# Patient Record
Sex: Female | Born: 1950 | Race: White | Hispanic: No | Smoking: Never smoker
Health system: Southern US, Community
[De-identification: ages and names within clinical notes are randomized; demographics above are authoritative.]

## PROBLEM LIST (undated history)

## (undated) DIAGNOSIS — G43909 Migraine, unspecified, not intractable, without status migrainosus: Secondary | ICD-10-CM

## (undated) DIAGNOSIS — F32A Depression, unspecified: Secondary | ICD-10-CM

## (undated) DIAGNOSIS — F329 Major depressive disorder, single episode, unspecified: Secondary | ICD-10-CM

## (undated) DIAGNOSIS — D219 Benign neoplasm of connective and other soft tissue, unspecified: Secondary | ICD-10-CM

## (undated) DIAGNOSIS — F419 Anxiety disorder, unspecified: Secondary | ICD-10-CM

## (undated) DIAGNOSIS — K219 Gastro-esophageal reflux disease without esophagitis: Secondary | ICD-10-CM

## (undated) HISTORY — DX: Major depressive disorder, single episode, unspecified: F32.9

## (undated) HISTORY — DX: Migraine, unspecified, not intractable, without status migrainosus: G43.909

## (undated) HISTORY — DX: Anxiety disorder, unspecified: F41.9

## (undated) HISTORY — DX: Gastro-esophageal reflux disease without esophagitis: K21.9

## (undated) HISTORY — DX: Depression, unspecified: F32.A

## (undated) HISTORY — DX: Benign neoplasm of connective and other soft tissue, unspecified: D21.9

## (undated) HISTORY — PX: INCONTINENCE SURGERY: SHX676

---

## 1990-06-09 HISTORY — PX: NEPHRECTOMY: SHX65

## 1995-06-10 HISTORY — PX: ABDOMINAL HYSTERECTOMY: SHX81

## 1999-04-23 ENCOUNTER — Encounter: Admission: RE | Admit: 1999-04-23 | Discharge: 1999-04-23 | Payer: Self-pay | Admitting: Family Medicine

## 1999-04-23 ENCOUNTER — Encounter: Payer: Self-pay | Admitting: Family Medicine

## 1999-04-30 ENCOUNTER — Encounter: Payer: Self-pay | Admitting: Family Medicine

## 1999-04-30 ENCOUNTER — Encounter: Admission: RE | Admit: 1999-04-30 | Discharge: 1999-04-30 | Payer: Self-pay | Admitting: Family Medicine

## 2000-05-05 ENCOUNTER — Encounter: Payer: Self-pay | Admitting: Family Medicine

## 2000-05-05 ENCOUNTER — Ambulatory Visit (HOSPITAL_COMMUNITY): Admission: RE | Admit: 2000-05-05 | Discharge: 2000-05-05 | Payer: Self-pay | Admitting: Family Medicine

## 2001-05-07 ENCOUNTER — Encounter: Payer: Self-pay | Admitting: Family Medicine

## 2001-05-07 ENCOUNTER — Ambulatory Visit (HOSPITAL_COMMUNITY): Admission: RE | Admit: 2001-05-07 | Discharge: 2001-05-07 | Payer: Self-pay | Admitting: Family Medicine

## 2002-05-26 ENCOUNTER — Ambulatory Visit (HOSPITAL_COMMUNITY): Admission: RE | Admit: 2002-05-26 | Discharge: 2002-05-26 | Payer: Self-pay | Admitting: Gastroenterology

## 2002-05-31 ENCOUNTER — Encounter: Payer: Self-pay | Admitting: Family Medicine

## 2002-05-31 ENCOUNTER — Ambulatory Visit (HOSPITAL_COMMUNITY): Admission: RE | Admit: 2002-05-31 | Discharge: 2002-05-31 | Payer: Self-pay | Admitting: Family Medicine

## 2003-06-14 ENCOUNTER — Ambulatory Visit (HOSPITAL_COMMUNITY): Admission: RE | Admit: 2003-06-14 | Discharge: 2003-06-14 | Payer: Self-pay | Admitting: Family Medicine

## 2004-01-22 ENCOUNTER — Ambulatory Visit (HOSPITAL_COMMUNITY): Admission: RE | Admit: 2004-01-22 | Discharge: 2004-01-22 | Payer: Self-pay | Admitting: Gastroenterology

## 2004-01-22 ENCOUNTER — Encounter (INDEPENDENT_AMBULATORY_CARE_PROVIDER_SITE_OTHER): Payer: Self-pay | Admitting: *Deleted

## 2004-07-16 ENCOUNTER — Ambulatory Visit (HOSPITAL_COMMUNITY): Admission: RE | Admit: 2004-07-16 | Discharge: 2004-07-16 | Payer: Self-pay | Admitting: Family Medicine

## 2004-09-19 ENCOUNTER — Encounter: Admission: RE | Admit: 2004-09-19 | Discharge: 2004-12-18 | Payer: Self-pay | Admitting: Urology

## 2005-07-28 ENCOUNTER — Ambulatory Visit (HOSPITAL_COMMUNITY): Admission: RE | Admit: 2005-07-28 | Discharge: 2005-07-28 | Payer: Self-pay | Admitting: Family Medicine

## 2005-07-28 ENCOUNTER — Encounter: Admission: RE | Admit: 2005-07-28 | Discharge: 2005-07-28 | Payer: Self-pay | Admitting: Family Medicine

## 2005-09-02 ENCOUNTER — Ambulatory Visit (HOSPITAL_COMMUNITY): Admission: RE | Admit: 2005-09-02 | Discharge: 2005-09-03 | Payer: Self-pay | Admitting: Urology

## 2006-06-08 ENCOUNTER — Encounter: Admission: RE | Admit: 2006-06-08 | Discharge: 2006-06-08 | Payer: Self-pay | Admitting: Family Medicine

## 2006-07-30 ENCOUNTER — Ambulatory Visit (HOSPITAL_COMMUNITY): Admission: RE | Admit: 2006-07-30 | Discharge: 2006-07-30 | Payer: Self-pay | Admitting: Family Medicine

## 2006-08-19 ENCOUNTER — Encounter (INDEPENDENT_AMBULATORY_CARE_PROVIDER_SITE_OTHER): Payer: Self-pay | Admitting: Specialist

## 2006-08-19 ENCOUNTER — Encounter: Admission: RE | Admit: 2006-08-19 | Discharge: 2006-08-19 | Payer: Self-pay | Admitting: Family Medicine

## 2007-08-30 ENCOUNTER — Ambulatory Visit (HOSPITAL_COMMUNITY): Admission: RE | Admit: 2007-08-30 | Discharge: 2007-08-30 | Payer: Self-pay | Admitting: Family Medicine

## 2008-10-26 ENCOUNTER — Ambulatory Visit (HOSPITAL_COMMUNITY): Admission: RE | Admit: 2008-10-26 | Discharge: 2008-10-26 | Payer: Self-pay | Admitting: Family Medicine

## 2008-10-26 ENCOUNTER — Encounter: Admission: RE | Admit: 2008-10-26 | Discharge: 2008-10-26 | Payer: Self-pay | Admitting: Family Medicine

## 2010-06-29 ENCOUNTER — Encounter: Payer: Self-pay | Admitting: Family Medicine

## 2010-10-25 NOTE — Op Note (Signed)
Dominique Scott, Dominique Scott                           ACCOUNT NO.:  [id number]   MEDICAL RECORD NO.:  [id number]                   PATIENT TYPE:  AMB   LOCATION:  ENDO                                 FACILITY:  [facility]   PHYSICIAN:  Anselmo Rod, M.D.               DATE OF BIRTH:  [birth year]   DATE OF PROCEDURE:  01/22/2004  DATE OF DISCHARGE:                                 OPERATIVE REPORT   PROCEDURE PERFORMED:  Colonoscopy with snare polypectomy x1.   ENDOSCOPIST:  Anselmo Rod, M.D.   INSTRUMENT USED:  Olympus videocolonoscope (adjustable pediatric scope).   INDICATION FOR THE PROCEDURE:  A 60 year old white female with a personal  history of liposarcoma of the right kidney undergoing screening colonoscopy.  The patient has had a right nephrectomy in the past.  She has had occasional  BRBPR which she attributes to her hemorrhoids.  There is no history of  change in bowel habits, abnormal weight loss, etc.   PREPROCEDURE PREPARATION:  Informed consent was procured from the patient.  The patient was fasted for eight hours prior to the procedure and prepped  with a bottle of magnesium citrate and a gallon of GoLYTELY the night prior  to the procedure.   PREPROCEDURE PHYSICAL:  VITAL SIGNS:  Stable.  NECK:  Supple.  CHEST:  Clear to auscultation.  S1 and S2 regular.  ABDOMEN:  Soft with normal bowel sounds.   DESCRIPTION OF THE PROCEDURE:  The patient was placed in the left lateral  decubitus position and sedated with 100 mg of Demerol and 10 mg of Versed iu  slow incremental doses.  Once the patient was adequately sedated and  maintained on low-flow oxygen and continuous cardiac monitoring, the Olympus  videocolonoscope was advanced from the rectum to the cecum.  The patient had  a tortuous colon.  The patient's position was changed from the left lateral  to the supine and then the right lateral position with gentle application of  abdominal pressure to reach the cecal base.   The appendicular orifice and  the ileocecal valve were visualized and photographed.  The terminal ileum  appeared healthy and without lesions.  A small sessile polyp was snared from  the proximal right colon by snare polypectomy.  No diverticulosis was noted.  No other masses or polyps were identified.  Retroflexion in the rectum  revealed small, nonbleeding internal hemorrhoids.  There was some residual  stool in the colon.  Multiple washes were done.  The patient's position was  changed to visualize the underlying mucosa.  Visualization was adequate.  The residual air in the colon was gently suctioned prior to withdrawal of  the scope.  The patient tolerated the procedure well without complications.   IMPRESSION:  1. Small, nonbleeding internal hemorrhoids.  2. Small sessile polyp snared from the proximal right colon.  3. Normal-appearing terminal ileum.  4. Essentially unrevealing colonoscopy  except for the polyp mentioned in the     proximal right colon.  5. Some residual stool in the colon, multiple washes done.   RECOMMENDATIONS:  1. Await pathology results.  2. Repeat colorectal cancer screening depending on pathology results.  3. Avoid nonsteroidals for the next four weeks.  4. High fiber diet with liberal fluid intake.  5. Outpatient followup as need arises in the future.                                               Anselmo Rod, M.D.    JNM/MEDQ  D:  01/22/2004  T:  01/22/2004  Job:  [id number]   cc:   Duncan Dull, M.D.  [address]  [place]  [place] [address]  Fax: [phone]   Maretta Bees. Vonita Moss, M.D.  [address]  [place]  [place] [address]  Fax: [phone]

## 2010-10-25 NOTE — Op Note (Signed)
Dominique Scott, Dominique Scott                 ACCOUNT NO.:  [id number]   MEDICAL RECORD NO.:  [id number]          PATIENT TYPE:  OIB   LOCATION:  1006                         FACILITY:  [facility]   PHYSICIAN:  Martina Sinner, MD DATE OF BIRTH:  May 25, 1951   DATE OF PROCEDURE:  09/02/2005  DATE OF DISCHARGE:  09/03/2005                                 OPERATIVE REPORT   SURGEON:  Scott A. MacDiarmid, MD   ASSISTANT:  Cornelious Bryant, MD   PREOPERATIVE DIAGNOSES:  1.  Cystocele.  2.  Rectocele.   POSTOPERATIVE DIAGNOSES:  1.  Cystocele.  2.  Rectocele.   OPERATION:  1.  Cystocele repair utilizing dermal graft.  2.  Rectocele repair.  3.  Cystocele.   Mrs. Barbary has a symptomatic cystocele with a mild posterior defect.  She  continued to have an A&P repair today.  She was given preoperative Unasyn  and gentamicin.  Extra care was taken to minimize the risk of compartment  syndrome neuropathy using Yellofin stirrups.   She had a moderate-sized grade 2 cystocele though a fairly short anterior  vaginal wall.  I initially marked the dimples at the apex with a 3-0 Vicryl  suture.  I then used a T-shaped incision, incising along the anterior  vaginal wall.  I used lidocaine with epinephrine to help in the dissection  and inject it submucosally.  Approximately 10 mL was utilized.  I then  dissected the anterior vaginal wall from the pubocervical fascia bilaterally  all the way to the white line bilaterally.  I used my usual maneuver to make  certain I was beyond the vaginal wall.  I hypermobilized the bladder up and  just beyond the apex of the vagina which was marked with a 3-0 Vicryl  suture.  She had a short anterior vaginal wall.  I then did one layer of  imbricating Kelly plication of the anterior vaginal wall with 2-0 Vicryl,  reapproximating the pubocervical fascia.  I then placed four 2-0 Ethibond to  the pelvic sidewall at each corner.  I brought these through a 6 x 4-cm  graft.   One centimeter of the 7 x 4-cm graft had been trimmed off.  This fit  beautifully passing the Ethibond through all 4 corners.  The graft was sewn  in tension-free, and it really fit nicely over the cystocele.  One 3-0  Vicryl was used to tack the most cephalad medial aspect of the graft to the  bladder near the apex.  I then trimmed approximately 0.75 cm from each  anterior vaginal wall edge and reapproximated the anterior vaginal wall with  running 2-0 Vicryl.  There was one bleeder anteriorly near her left  urethrovesical angle that I over-sewed with an interrupted 2-0 Vicryl.  Overall hemostasis was good.  A running locking suture was used for the  anterior vaginal wall epithelium.  I then did a digital rectal examination.  I really felt that she needed a posterior repair since she had diffuse  weakness and bulging.   I then placed 2 Allis clamps inferior near the  hymenal ring and excised a  small triangle of perineal skin.  I then made a long anterior vaginal wall  incision utilizing my usual Allis technique to approximately 1 cm from the  marked dimples at the apex.  I mobilized the rectovaginal fascia from the  posterior vaginal wall epithelium widely, each to the pelvis sidewall in  each side.  There was no enterocele.  I did a digital rectal examination,  and she had diffuse weakness and no obvious site defect.  I therefore did a  2-layer running imbricating closure using 2-0 Vicryl, making sure that it  was tension-free and not too tight.  She had a few bleeding points that were  fulgurated, and one was oversewn, especially on her left side.  Hemostasis  was actually very good after this maneuver.  I then trimmed approximately  0.5 cm of vaginal epithelium off each side.  I identified the apex with an  Allis clamp.  I then closed the posterior vaginal wall with running 2-0  Vicryl, locking each suture.  At the level of the hymenal ring, I stopped my  running closure and did 1-0  Ethibond reapproximating the perineal body  gently.  I then re-delivered the 2-0 Vicryl into the perineal incision and  closed the skin with running subcuticular suture.   When I was doing the posterior repair, she was bleeding a little bit from  the anterior vaginal wall, but we only needed to aspirate blood on 2 or 3  occasions during the entire posterior repair from bleeding anteriorly.  I  felt that the bleeding was minimal and should be taken care of with a pack.  The posterior repair was actually quite dry before I closed, but as I was  putting in the pack, she was bleeding more than I anticipated through the  perineal skin closure.  For this reason, I placed a total of 3 packs into  the vaginal area firmly.  By no means did I feel that I had to re-open up  any incisions since the bleeding was not brisk whatsoever, but it was more  of a nuisance, and an external pad will have to be used postoperatively.  I  am going to keep a close eye on her hematocrit in the recovery room and  later on this afternoon.  She had a good hemoglobin prior to surgery.   Total blood loss during the case was approximately 350 mL.  She was  hemodynamically stable.   The patient had been cystoscoped after the anterior repair, and she had  efflux of indigo carmine from the left ureteral orifice.  She had the  typical camel hump bulging in the midline due to the imbrication.  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  Her preoperative creatinine was 1.0.   The Foley catheter was draining well at the end of the case.  Leg position  was good at the end of the case.  The patient was taken to the recovery  room.           ______________________________  Martina Sinner, MD  Electronically Signed     SAM/MEDQ  D:  09/02/2005  T:  09/04/2005  Job:  [id number]

## 2010-12-02 ENCOUNTER — Other Ambulatory Visit: Payer: Self-pay | Admitting: Dermatology

## 2010-12-25 ENCOUNTER — Other Ambulatory Visit: Payer: Self-pay | Admitting: Family Medicine

## 2010-12-25 ENCOUNTER — Ambulatory Visit
Admission: RE | Admit: 2010-12-25 | Discharge: 2010-12-25 | Disposition: A | Discharge status: Home / Self Care | Payer: BC Managed Care – PPO | Source: Ambulatory Visit | Attending: Family Medicine | Admitting: Family Medicine

## 2010-12-25 DIAGNOSIS — Z0189 Encounter for other specified special examinations: Secondary | ICD-10-CM

## 2012-07-23 ENCOUNTER — Encounter: Payer: Self-pay | Admitting: Certified Nurse Midwife

## 2012-07-28 ENCOUNTER — Encounter: Payer: Self-pay | Admitting: Certified Nurse Midwife

## 2012-09-03 ENCOUNTER — Encounter: Payer: Self-pay | Admitting: Certified Nurse Midwife

## 2012-09-07 ENCOUNTER — Ambulatory Visit (INDEPENDENT_AMBULATORY_CARE_PROVIDER_SITE_OTHER): Payer: 59 | Admitting: Certified Nurse Midwife

## 2012-09-07 ENCOUNTER — Encounter: Payer: Self-pay | Admitting: Certified Nurse Midwife

## 2012-09-07 VITALS — BP 100/62 | Ht 66.0 in | Wt 139.0 lb

## 2012-09-07 DIAGNOSIS — Z01419 Encounter for gynecological examination (general) (routine) without abnormal findings: Secondary | ICD-10-CM

## 2012-09-07 DIAGNOSIS — N952 Postmenopausal atrophic vaginitis: Secondary | ICD-10-CM | POA: Insufficient documentation

## 2012-09-07 NOTE — Patient Instructions (Signed)

## 2012-09-07 NOTE — Progress Notes (Signed)
62 y.o. DivorcedCaucasian female   [id number] here for annual exam. Menopausal denies vaginal bleeding.  Using Estrace cream for vaginal dryness with good results weekly. Olive oil works well Medications stable with hypertension  Sees PCP for medication management.  No issues today.   Patient's last menstrual period was 06/10/1995.          Sexually active: no  The current method of family planning is status post hysterectomy.    Exercising: yes  aerobic & walking Last mammogram: per patient 2013 Last pap: 1997  Last BMD: over 6 yrs per patient Alcohol: 1-2 a week Tobacco: none   Health Maintenance  Topic Date Due  . Influenza Vaccine  02/07/1954  . Pap Smear  03/13/1971  . Tetanus/tdap  03/12/1972  . Mammogram  10/27/2010  . Colonoscopy  01/07/2021    Family History  Problem Relation Age of Onset  . Hypertension Father     There is no problem list on file for this patient.   Past Medical History  Diagnosis Date  . Migraines   . GERD (gastroesophageal reflux disease)   . Anxiety   . Depression   . Fibroid     Past Surgical History  Procedure Laterality Date  . 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 Abdominal hysterectomy  1997    TAH-fibroids  . Incontinence surgery      bladder sling    Allergies: Contrast media  Current Outpatient Prescriptions  Medication Sig Dispense Refill  . ALPRAZolam (XANAX) 0.25 MG tablet Take 0.25 mg by mouth at bedtime as needed for sleep.      . AmLODIPine Besylate (NORVASC PO) Take by mouth daily.       . Artificial Tear Ointment (DRY EYES OP) Apply 1 drop to eye 2 (two) times daily.      . Atorvastatin Calcium (LIPITOR PO) Take by mouth daily.      . Cetirizine HCl (ZYRTEC ALLERGY PO) Take by mouth.      . cycloSPORINE (RESTASIS) 0.05 % ophthalmic emulsion 1 drop 2 (two) times daily.      Marland Kitchen doxycycline (PERIOSTAT) 20 MG tablet 3 (three) times a week.      . escitalopram (LEXAPRO) 10 MG tablet Take  10 mg by mouth.      . estradiol (ESTRACE) 0.1 MG/GM vaginal cream Place 1 g vaginally. Once a week      . EVENING PRIMROSE OIL PO Take by mouth daily.      Marland Kitchen lisinopril-hydrochlorothiazide (PRINZIDE,ZESTORETIC) 20-12.5 MG per tablet Take 1 tablet by mouth daily.      . mometasone (NASONEX) 50 MCG/ACT nasal spray Place 2 sprays into the nose.      . Multiple Vitamins-Minerals (MULTIVITAMIN PO) Take by mouth daily.      . Omega-3 Fatty Acids (FISH OIL PO) Take by mouth daily.      . potassium chloride SA (K-DUR,KLOR-CON) 20 MEQ tablet daily.      . rizatriptan (MAXALT) 5 MG tablet Take 5 mg by mouth as needed for migraine. May repeat in 2 hours if needed      . zolpidem (AMBIEN CR) 6.25 MG CR tablet Take 6.25 mg by mouth at bedtime as needed for sleep.       No current facility-administered medications for this visit.    ROS: A comprehensive review of systems was negative.  Exam:    BP 100/62  Ht 5\' 6"  (1.676 m)  Wt 139 lb (63.05 kg)  BMI 22.45 kg/m2  LMP 06/10/1995 Weight change: @WEIGHTCHANGE @ Last 3 height recordings:  Ht Readings from Last 3 Encounters:  09/07/12 5\' 6"  (1.676 m)   General appearance: alert and cooperative Head: Normocephalic, without obvious abnormality, atraumatic Neck: no adenopathy, supple, symmetrical, trachea midline and thyroid not enlarged, symmetric, no tenderness/mass/nodules Lungs: clear to auscultation bilaterally Breasts: normal appearance, no masses or tenderness, Inspection negative, No nipple retraction or dimpling Heart: regular rate and rhythm Abdomen: soft, non-tender; bowel sounds normal; no masses,  no organomegaly Extremities: extremities normal, atraumatic, no cyanosis or edema Skin: Skin color, texture, turgor normal. No rashes or lesions Lymph nodes: Cervical, supraclavicular, and axillary nodes normal. no inguinal nodes palpated Neurologic: Alert and oriented X 3, normal strength and tone. Normal symmetric reflexes. Normal coordination  and gait   Pelvic: External genitalia:  no lesions              Urethra: normal appearing urethra with no masses, tenderness or lesions              Bartholins and Skenes: normal, Bartholin's, Urethra, Skene's normal                 Vagina: normal appearing vagina with normal color and discharge, no lesions, atrophic              Cervix: absent              Pap taken: no        Bimanual Exam:  Uterus:  absent                                      Adnexa: no masses                                      Rectovaginal: Confirms                                      Anus:  normal sphincter tone, no lesions  A: well woman exam Menopausal so HRT s/p TAH due fibroids Hypertension stable on medication Atrophic vaginitis uses Estrace cream     P:Reviewed Health and wellness pertinent to exam Aware of need to advise if vaginal bleeding Continue follow up with MD Declines Rx would like to use Olive Oil exclusively unless changes occur.  Still has some Estrace cream if needed. Mammogram yearly RV annual   An After Visit Summary was printed and given to the patient.

## 2012-11-22 ENCOUNTER — Other Ambulatory Visit: Payer: Self-pay | Admitting: Certified Nurse Midwife

## 2012-11-22 NOTE — Telephone Encounter (Signed)
eScribe request for refill on ESTRACE VAGINAL CREAM Last filled - 08/27/11 Last AEX - 09/07/12 Next AEX - not scheduled Please advise refills.  Chart on your shelf.

## 2013-01-10 ENCOUNTER — Other Ambulatory Visit (HOSPITAL_COMMUNITY): Payer: Self-pay | Admitting: Family Medicine

## 2013-01-10 DIAGNOSIS — Z1231 Encounter for screening mammogram for malignant neoplasm of breast: Secondary | ICD-10-CM

## 2013-01-11 ENCOUNTER — Other Ambulatory Visit: Payer: Self-pay | Admitting: Family Medicine

## 2013-01-11 ENCOUNTER — Ambulatory Visit
Admission: RE | Admit: 2013-01-11 | Discharge: 2013-01-11 | Disposition: A | Discharge status: Home / Self Care | Payer: 59 | Source: Ambulatory Visit | Attending: Family Medicine | Admitting: Family Medicine

## 2013-01-11 DIAGNOSIS — C499 Malignant neoplasm of connective and soft tissue, unspecified: Secondary | ICD-10-CM

## 2013-01-13 ENCOUNTER — Ambulatory Visit (HOSPITAL_COMMUNITY)
Admission: RE | Admit: 2013-01-13 | Discharge: 2013-01-13 | Disposition: A | Discharge status: Home / Self Care | Payer: 59 | Source: Ambulatory Visit | Attending: Family Medicine | Admitting: Family Medicine

## 2013-01-13 DIAGNOSIS — Z1231 Encounter for screening mammogram for malignant neoplasm of breast: Secondary | ICD-10-CM | POA: Insufficient documentation

## 2013-04-14 ENCOUNTER — Other Ambulatory Visit: Payer: Self-pay

## 2013-08-03 ENCOUNTER — Telehealth: Payer: Self-pay | Admitting: Certified Nurse Midwife

## 2013-08-03 MED ORDER — ESTRADIOL 10 MCG VA TABS: ORAL_TABLET | VAGINAL | Status: DC

## 2013-08-03 NOTE — Telephone Encounter (Signed)
Routing to Debbi for review, can patient switch to Vagifem or Estring?

## 2013-08-03 NOTE — Telephone Encounter (Signed)
Patient needs a refill for ESTRACE VAGINAL 0.1 MG/GM vaginal cream  USE 1 GRAM VAGINALLY TWICE WEEKLY AS INSTRUCTED BY YOUR DOCTOR, Normal, Last Dose: Not Recorded  Refills: 3 ordered Pharmacy: [id number], [place]  but insurances wants her to switch to a lower tier price medication here were her options: vagifem Estring

## 2013-08-03 NOTE — Telephone Encounter (Signed)
Patient can switch to Vagifem, but will need instructions to use applicator holding tablet 2 x weekly

## 2013-08-03 NOTE — Telephone Encounter (Signed)
Order placed.  Message left to return call to [place] at [phone].

## 2013-08-05 NOTE — Telephone Encounter (Signed)
Detailed message left with instructions on how to use Vagifem with applicator 2 times per week. Advised need to schedule AEX with Regina Eck CNM as refills were placed until time for annual.  Routing to provider for final review. Patient agreeable to disposition. Will close encounter

## 2013-08-05 NOTE — Telephone Encounter (Signed)
Patient returning Tracy's call and states, "It is fine to leave a detailed message on voicemail."

## 2013-08-17 ENCOUNTER — Other Ambulatory Visit: Payer: Self-pay | Admitting: *Deleted

## 2013-08-17 NOTE — Telephone Encounter (Signed)
Fax From: [facility] for Vagifem Last Refilled: 08/03/13 #8/2 refills Aex Scheduled: 09/14/13  Fax requesting new rx is okay to send in rx or have patient wait until AEX to get years worth?  Please advise.

## 2013-08-17 NOTE — Telephone Encounter (Signed)
Wait for aex so we can assess

## 2013-08-18 MED ORDER — ESTRADIOL 10 MCG VA TABS: ORAL_TABLET | VAGINAL | Status: DC

## 2013-08-19 NOTE — Telephone Encounter (Signed)
Okay; thanks.

## 2013-09-14 ENCOUNTER — Ambulatory Visit (INDEPENDENT_AMBULATORY_CARE_PROVIDER_SITE_OTHER): Payer: 59 | Admitting: Certified Nurse Midwife

## 2013-09-14 ENCOUNTER — Encounter: Payer: Self-pay | Admitting: Certified Nurse Midwife

## 2013-09-14 VITALS — BP 120/66 | HR 76 | Ht 66.25 in | Wt 133.0 lb

## 2013-09-14 DIAGNOSIS — Z0000 Encounter for general adult medical examination without abnormal findings: Secondary | ICD-10-CM

## 2013-09-14 DIAGNOSIS — Z01419 Encounter for gynecological examination (general) (routine) without abnormal findings: Secondary | ICD-10-CM

## 2013-09-14 DIAGNOSIS — N952 Postmenopausal atrophic vaginitis: Secondary | ICD-10-CM

## 2013-09-14 LAB — POCT URINALYSIS DIPSTICK
BILIRUBIN UA: NEGATIVE
Blood, UA: NEGATIVE
Glucose, UA: NEGATIVE
KETONES UA: NEGATIVE
LEUKOCYTES UA: NEGATIVE
Nitrite, UA: NEGATIVE
PH UA: 7
Protein, UA: NEGATIVE
Urobilinogen, UA: NEGATIVE

## 2013-09-14 MED ORDER — ESTRADIOL 10 MCG VA TABS: ORAL_TABLET | VAGINAL | Status: DC

## 2013-09-14 NOTE — Progress Notes (Signed)
Patient ID: Dominique Scott, female   DOB: 1950-07-12, 63 y.o.   MRN: [id number] 63 y.o. [id number] Divorced Caucasian Fe here for annual exam. Menopausal no HRT, no vaginal bleeding. Vaginal dryness better with Estrace cream, but now using Vagifem which is working well. Working Scientist, research (medical) now, enjoying the change. Sees PCP for medication management and aex/labs. No changes in medications. No health issues today.  Patient's last menstrual period was 06/10/1995.          Sexually active: no  The current method of family planning is abstinence.    Exercising: yes  Gym/ health club routine includes cardio 3 times per week, yoga and pilates every other day.. Smoker:  no  Health Maintenance: Pap:  1997 MMG:  01/13/13; Bi-Rads 1: negative Colonoscopy:  01/08/11, polyp BMD:  2010 TDaP:  UTD Labs:  HB:  PCP Urine:negative     reports that she has never smoked. She has never used smokeless tobacco. She reports that she drinks about one ounce of alcohol per week. She reports that she does not use illicit drugs.  Past Medical History  Diagnosis Date  . Migraines   . GERD (gastroesophageal reflux disease)   . Anxiety   . Depression   . Fibroid     Past Surgical History  Procedure Laterality Date  . 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 Abdominal hysterectomy  1997    TAH-fibroids  . Incontinence surgery      bladder sling    Current Outpatient Prescriptions  Medication Sig Dispense Refill  . ALPRAZolam (XANAX) 0.25 MG tablet Take 0.25 mg by mouth at bedtime as needed for sleep.      . AmLODIPine Besylate (NORVASC PO) Take by mouth daily.       . Artificial Tear Ointment (DRY EYES OP) Apply 1 drop to eye 2 (two) times daily.      . Atorvastatin Calcium (LIPITOR PO) Take by mouth daily.      . Cetirizine HCl (ZYRTEC ALLERGY PO) Take by mouth.      . cholecalciferol (VITAMIN D) 1000 UNITS tablet Take 1,000 Units by mouth daily.      . cycloSPORINE (RESTASIS) 0.05 %  ophthalmic emulsion 1 drop 2 (two) times daily.      Marland Kitchen doxycycline (PERIOSTAT) 20 MG tablet 3 (three) times a week.      . escitalopram (LEXAPRO) 10 MG tablet Take 10 mg by mouth.      . Estradiol 10 MCG TABS vaginal tablet Place one tablet vaginally with applicator two times per week.  8 tablet  2  . EVENING PRIMROSE OIL PO Take by mouth daily.      Marland Kitchen lisinopril-hydrochlorothiazide (PRINZIDE,ZESTORETIC) 20-12.5 MG per tablet Take 1 tablet by mouth daily.      . Lutein 10 MG TABS Take 1 tablet by mouth daily.      . Misc Natural Products (GLUCOS-CHONDROIT-MSM COMPLEX PO) Take 1 tablet by mouth daily.      . mometasone (NASONEX) 50 MCG/ACT nasal spray Place 2 sprays into the nose.      . Omega-3 Fatty Acids (FISH OIL PO) Take by mouth daily.      . potassium chloride SA (K-DUR,KLOR-CON) 20 MEQ tablet daily.      . rizatriptan (MAXALT) 5 MG tablet Take 5 mg by mouth as needed for migraine. May repeat in 2 hours if needed      . zolpidem (AMBIEN CR) 6.25 MG CR tablet Take 6.25  mg by mouth at bedtime as needed for sleep.       No current facility-administered medications for this visit.    Family History  Problem Relation Age of Onset  . Hypertension Father     ROS:  Pertinent items are noted in HPI.  Otherwise, a comprehensive ROS was negative.  Exam:   BP 120/66  Pulse 76  Ht 5' 6.25" (1.683 m)  Wt 133 lb (60.328 kg)  BMI 21.30 kg/m2  LMP 06/10/1995 Height: 5' 6.25" (168.3 cm)  Ht Readings from Last 3 Encounters:  09/14/13 5' 6.25" (1.683 m)  09/07/12 5\' 6"  (1.676 m)    General appearance: alert, cooperative and appears stated age Head: Normocephalic, without obvious abnormality, atraumatic Neck: no adenopathy, supple, symmetrical, trachea midline and thyroid normal to inspection and palpation and non-palpable Lungs: clear to auscultation bilaterally Breasts: normal appearance, no masses or tenderness, No nipple retraction or dimpling, No nipple discharge or bleeding, No  axillary or supraclavicular adenopathy Heart: regular rate and rhythm Abdomen: soft, non-tender; no masses,  no organomegaly Extremities: extremities normal, atraumatic, no cyanosis or edema Skin: Skin color, texture, turgor normal. No rashes or lesions Lymph nodes: Cervical, supraclavicular, and axillary nodes normal. No abnormal inguinal nodes palpated Neurologic: Grossly normal   Pelvic: External genitalia:  no lesions              Urethra:  normal appearing urethra with no masses, tenderness or lesions              Bartholin's and Skene's: normal                 Vagina: normal appearing vagina with normal color and discharge, no lesions              Cervix: absent              Pap taken: no Bimanual Exam:  Uterus:  uterus absent              Adnexa: normal adnexa and no mass, fullness, tenderness               Rectovaginal: Confirms               Anus:  normal sphincter tone, no lesions  A:  Well Woman with normal exam  Menopausal no HRT S/P TAH fibroids, ovaries retained  Atrophic vaginitis Vagifem working well  History of nephrectomy due liposarcoma  Hypertension/cholesterol/anxiety management with PCP   P:   Reviewed health and wellness pertinent to exam  Advise if problems with Vagifem has refills  Continue follow up as indicated  Pap smear as per guidelines   Mammogram yearly pap smear not taken today.  counseled on breast self exam, mammography screening, adequate intake of calcium and vitamin D, diet and exercise  return annually or prn  An After Visit Summary was printed and given to the patient.

## 2013-09-14 NOTE — Patient Instructions (Signed)

## 2013-09-16 NOTE — Progress Notes (Signed)
Reviewed personally.  M. Suzanne Mei Suits, MD.  

## 2013-12-15 ENCOUNTER — Other Ambulatory Visit (HOSPITAL_COMMUNITY): Payer: Self-pay | Admitting: Family Medicine

## 2013-12-15 DIAGNOSIS — Z1231 Encounter for screening mammogram for malignant neoplasm of breast: Secondary | ICD-10-CM

## 2014-01-16 ENCOUNTER — Ambulatory Visit (HOSPITAL_COMMUNITY)
Admission: RE | Admit: 2014-01-16 | Discharge: 2014-01-16 | Disposition: A | Discharge status: Home / Self Care | Payer: 59 | Source: Ambulatory Visit | Attending: Family Medicine | Admitting: Family Medicine

## 2014-01-16 DIAGNOSIS — Z1231 Encounter for screening mammogram for malignant neoplasm of breast: Secondary | ICD-10-CM | POA: Diagnosis present

## 2014-04-10 ENCOUNTER — Encounter: Payer: Self-pay | Admitting: Certified Nurse Midwife

## 2014-09-18 ENCOUNTER — Ambulatory Visit: Payer: 59 | Admitting: Certified Nurse Midwife

## 2014-10-19 ENCOUNTER — Ambulatory Visit: Payer: Self-pay | Admitting: Certified Nurse Midwife

## 2014-10-19 ENCOUNTER — Ambulatory Visit (INDEPENDENT_AMBULATORY_CARE_PROVIDER_SITE_OTHER): Payer: 59 | Admitting: Certified Nurse Midwife

## 2014-10-19 ENCOUNTER — Encounter: Payer: Self-pay | Admitting: Certified Nurse Midwife

## 2014-10-19 VITALS — BP 102/64 | HR 68 | Resp 16 | Ht 66.25 in | Wt 135.0 lb

## 2014-10-19 DIAGNOSIS — Z01419 Encounter for gynecological examination (general) (routine) without abnormal findings: Secondary | ICD-10-CM

## 2014-10-19 DIAGNOSIS — Z0000 Encounter for general adult medical examination without abnormal findings: Secondary | ICD-10-CM

## 2014-10-19 LAB — POCT URINALYSIS DIPSTICK
BILIRUBIN UA: NEGATIVE
GLUCOSE UA: NEGATIVE
KETONES UA: NEGATIVE
LEUKOCYTES UA: NEGATIVE
Nitrite, UA: NEGATIVE
PH UA: 5
Protein, UA: NEGATIVE
RBC UA: NEGATIVE
Urobilinogen, UA: NEGATIVE

## 2014-10-19 NOTE — Patient Instructions (Signed)

## 2014-10-19 NOTE — Progress Notes (Signed)
64 y.o. [id number] Divorced  Caucasian Fe here for annual exam. Menopausal no HRT. Denies vaginal bleeding or vaginal dryness. Olive oil working well for dryness. Sees PCP for aex and medication management for anxiety/ cholesterol/ labs. No health issues today.  Patient's last menstrual period was 06/10/1995.          Sexually active: No.  The current method of family planning is status post hysterectomy.    Exercising: Yes.    cardio,yoga,stretching Smoker:  no  Health Maintenance: Pap:  1997 MMG: 01-16-14 category b density,birads 1:neg Colonoscopy:  2012 polyp BMD:   2010 TDaP: up to date maybe 2011 Labs: Poct urine-neg Self breast exam: done monthly   reports that she has never smoked. She has never used smokeless tobacco. She reports that she drinks alcohol. She reports that she does not use illicit drugs.  Past Medical History  Diagnosis Date  . Migraines   . GERD (gastroesophageal reflux disease)   . Anxiety   . Depression   . Fibroid     Past Surgical History  Procedure Laterality Date  . 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 Abdominal hysterectomy  1997    TAH-fibroids  . Incontinence surgery      bladder sling    Current Outpatient Prescriptions  Medication Sig Dispense Refill  . ALPRAZolam (XANAX) 0.25 MG tablet Take 0.25 mg by mouth at bedtime as needed for sleep.    Marland Kitchen amLODipine (NORVASC) 5 MG tablet     . atorvastatin (LIPITOR) 10 MG tablet     . Cetirizine HCl (ZYRTEC ALLERGY PO) Take by mouth.    . cholecalciferol (VITAMIN D) 1000 UNITS tablet Take 1,000 Units by mouth daily.    . cycloSPORINE (RESTASIS) 0.05 % ophthalmic emulsion 1 drop 2 (two) times daily.    Marland Kitchen doxycycline (PERIOSTAT) 20 MG tablet 3 (three) times a week.    . escitalopram (LEXAPRO) 10 MG tablet Take 10 mg by mouth.    . Estradiol 10 MCG TABS vaginal tablet Place one tablet vaginally with applicator two times per week. 8 tablet 12  .  lisinopril-hydrochlorothiazide (PRINZIDE,ZESTORETIC) 20-25 MG per tablet     . Lutein 10 MG TABS Take 1 tablet by mouth daily.    . Misc Natural Products (GLUCOS-CHONDROIT-MSM COMPLEX PO) Take 1 tablet by mouth daily.    . mometasone (NASONEX) 50 MCG/ACT nasal spray Place 2 sprays into the nose.    . potassium chloride SA (K-DUR,KLOR-CON) 20 MEQ tablet daily.    . rizatriptan (MAXALT) 10 MG tablet   0  . zolpidem (AMBIEN CR) 6.25 MG CR tablet Take 6.25 mg by mouth at bedtime as needed for sleep.     No current facility-administered medications for this visit.    Family History  Problem Relation Age of Onset  . Hypertension Father     ROS:  Pertinent items are noted in HPI.  Otherwise, a comprehensive ROS was negative.  Exam:   BP 102/64 mmHg  Pulse 68  Resp 16  Ht 5' 6.25" (1.683 m)  Wt 135 lb (61.236 kg)  BMI 21.62 kg/m2  LMP 06/10/1995 Height: 5' 6.25" (168.3 cm) Ht Readings from Last 3 Encounters:  10/19/14 5' 6.25" (1.683 m)  09/14/13 5' 6.25" (1.683 m)  09/07/12 5\' 6"  (1.676 m)    General appearance: alert, cooperative and appears stated age Head: Normocephalic, without obvious abnormality, atraumatic Neck: no adenopathy, supple, symmetrical, trachea midline and thyroid normal to inspection and  palpation Lungs: clear to auscultation bilaterally Breasts: normal appearance, no masses or tenderness, No nipple retraction or dimpling, No nipple discharge or bleeding, No axillary or supraclavicular adenopathy Heart: regular rate and rhythm Abdomen: soft, non-tender; no masses,  no organomegaly Extremities: extremities normal, atraumatic, no cyanosis or edema Skin: Skin color, texture, turgor normal. No rashes or lesions Lymph nodes: Cervical, supraclavicular, and axillary nodes normal. No abnormal inguinal nodes palpated Neurologic: Grossly normal   Pelvic: External genitalia:  no lesions              Urethra:  normal appearing urethra with no masses, tenderness or  lesions              Bartholin's and Skene's: normal                 Vagina: normal appearing vagina with normal color and discharge, no lesions              Cervix: absent              Pap taken: No. Bimanual Exam:  Uterus:  uterus absent              Adnexa: normal adnexa and no mass, fullness, tenderness               Rectovaginal: Confirms               Anus:  normal sphincter tone, no lesions  Chaperone present: Yes  A:  Well Woman with normal exam  Menopausal no HRT S/P TAH due to fibroids  Atrophic vaginitis with Vagifem use working well  Hypertension/cholesterol/anxiety with PCP management  BMD due schedules with Dr. Inda Merlin  P:   Reviewed health and wellness pertinent to exam  Rx Vagifem not given per request will call when needed, continue Olive Oil use for moisture also  Continue MD follow up as indicated, reminded of BMD and due  Pap smear not taken today   counseled on breast self exam, mammography screening, adequate intake of calcium and vitamin D, diet and exercise  return annually or prn  An After Visit Summary was printed and given to the patient.

## 2014-10-22 NOTE — Progress Notes (Signed)
Reviewed personally.  M. Suzanne Tegh Franek, MD.  

## 2014-12-07 ENCOUNTER — Other Ambulatory Visit: Payer: Self-pay

## 2014-12-07 ENCOUNTER — Other Ambulatory Visit (HOSPITAL_COMMUNITY): Payer: Self-pay | Admitting: Family Medicine

## 2014-12-07 DIAGNOSIS — Z1231 Encounter for screening mammogram for malignant neoplasm of breast: Secondary | ICD-10-CM

## 2014-12-08 ENCOUNTER — Other Ambulatory Visit: Payer: Self-pay | Admitting: Family Medicine

## 2014-12-08 DIAGNOSIS — E2839 Other primary ovarian failure: Secondary | ICD-10-CM

## 2015-01-22 ENCOUNTER — Ambulatory Visit
Admission: RE | Admit: 2015-01-22 | Discharge: 2015-01-22 | Disposition: A | Discharge status: Home / Self Care | Payer: 59 | Source: Ambulatory Visit | Attending: Family Medicine | Admitting: Family Medicine

## 2015-01-22 ENCOUNTER — Ambulatory Visit
Admission: RE | Admit: 2015-01-22 | Discharge: 2015-01-22 | Disposition: A | Discharge status: Home / Self Care | Payer: 59 | Source: Ambulatory Visit

## 2015-01-22 DIAGNOSIS — Z1231 Encounter for screening mammogram for malignant neoplasm of breast: Secondary | ICD-10-CM

## 2015-01-22 DIAGNOSIS — E2839 Other primary ovarian failure: Secondary | ICD-10-CM

## 2015-02-05 ENCOUNTER — Other Ambulatory Visit: Payer: Self-pay | Admitting: Certified Nurse Midwife

## 2015-02-05 NOTE — Telephone Encounter (Signed)
Medication refill request: Vagifem  Last AEX:  10/19/14 DL Next AEX: 10/25/15 DL Last MMG (if hormonal medication request): 01/23/15 BIRADS1:neg Refill authorized: 09/14/13 #8tabs/ 12R. Today please advise.   Routed to [facility]

## 2015-10-24 ENCOUNTER — Ambulatory Visit: Payer: 59 | Admitting: Certified Nurse Midwife

## 2015-10-25 ENCOUNTER — Ambulatory Visit: Payer: 59 | Admitting: Certified Nurse Midwife

## 2015-10-26 ENCOUNTER — Ambulatory Visit (INDEPENDENT_AMBULATORY_CARE_PROVIDER_SITE_OTHER): Payer: 59 | Admitting: Certified Nurse Midwife

## 2015-10-26 ENCOUNTER — Encounter: Payer: Self-pay | Admitting: Certified Nurse Midwife

## 2015-10-26 VITALS — BP 110/62 | HR 68 | Resp 16 | Ht 65.75 in | Wt 139.0 lb

## 2015-10-26 DIAGNOSIS — Z01419 Encounter for gynecological examination (general) (routine) without abnormal findings: Secondary | ICD-10-CM | POA: Diagnosis not present

## 2015-10-26 DIAGNOSIS — IMO0002 Reserved for concepts with insufficient information to code with codable children: Secondary | ICD-10-CM

## 2015-10-26 DIAGNOSIS — N952 Postmenopausal atrophic vaginitis: Secondary | ICD-10-CM | POA: Diagnosis not present

## 2015-10-26 DIAGNOSIS — Z0000 Encounter for general adult medical examination without abnormal findings: Secondary | ICD-10-CM

## 2015-10-26 DIAGNOSIS — IMO0001 Reserved for inherently not codable concepts without codable children: Secondary | ICD-10-CM

## 2015-10-26 DIAGNOSIS — N8110 Cystocele, unspecified: Secondary | ICD-10-CM | POA: Diagnosis not present

## 2015-10-26 LAB — POCT URINALYSIS DIPSTICK
Bilirubin, UA: NEGATIVE
Glucose, UA: NEGATIVE
Ketones, UA: NEGATIVE
Leukocytes, UA: NEGATIVE
NITRITE UA: NEGATIVE
Protein, UA: NEGATIVE
RBC UA: NEGATIVE
UROBILINOGEN UA: NEGATIVE
pH, UA: 5

## 2015-10-26 NOTE — Progress Notes (Signed)
65 y.o. [id number] Divorced  Caucasian Fe here for annual exam. Menopausal no vaginal bleeding or vaginal dryness. Uses Olive Oil for vaginal dryness with good results and Vagifem.. Occasional stress incontinence. Working on [employer]. Denies any urinary frequency or urgency or pain. Had BMD with Dr. Inda Merlin some spine degeneration. Sees PCP for medication management for cholesterol/ hypertension/headache management/labs/aex. No health issues today. Enjoying life!  Patient's last menstrual period was 06/10/1995.          Sexually active: No.  The current method of family planning is status post hysterectomy.    Exercising: Yes.    cardio & yoga Smoker:  no  Health Maintenance: Pap:  1997 neg MMG: 01-22-15 category b density,birads 1:neg Colonoscopy:  2012 polyp f/u 57yrs BMD:   2016 TDaP:  2011 Shingles: no Pneumonia: no Hep C and HIV: HIV neg 13yrs ago Labs: poct urine-neg Self breast exam: done occ   reports that she has never smoked. She has never used smokeless tobacco. She reports that she drinks about 0.6 - 1.2 oz of alcohol per week. She reports that she does not use illicit drugs.  Past Medical History  Diagnosis Date  . Migraines   . GERD (gastroesophageal reflux disease)   . Anxiety   . Depression   . Fibroid     Past Surgical History  Procedure Laterality Date  . 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 Abdominal hysterectomy  1997    TAH-fibroids  . Incontinence surgery      bladder sling    Current Outpatient Prescriptions  Medication Sig Dispense Refill  . ALPRAZolam (XANAX) 0.25 MG tablet Take 0.25 mg by mouth at bedtime as needed for sleep.    Marland Kitchen amLODipine (NORVASC) 5 MG tablet     . atorvastatin (LIPITOR) 10 MG tablet     . cholecalciferol (VITAMIN D) 1000 UNITS tablet Take 1,000 Units by mouth daily.    . cycloSPORINE (RESTASIS) 0.05 % ophthalmic emulsion 1 drop 2 (two) times daily.    Marland Kitchen doxycycline (PERIOSTAT) 20 MG tablet 2  (two) times a week.     . escitalopram (LEXAPRO) 10 MG tablet Take 10 mg by mouth.    Marland Kitchen lisinopril-hydrochlorothiazide (PRINZIDE,ZESTORETIC) 20-25 MG per tablet     . loratadine (CLARITIN) 10 MG tablet Take 10 mg by mouth daily.    . Lutein 10 MG TABS Take 1 tablet by mouth daily.    . Misc Natural Products (GLUCOS-CHONDROIT-MSM COMPLEX PO) Take 1 tablet by mouth daily.    . mometasone (NASONEX) 50 MCG/ACT nasal spray Place 2 sprays into the nose as needed.     . potassium chloride SA (K-DUR,KLOR-CON) 20 MEQ tablet daily.    . rizatriptan (MAXALT) 10 MG tablet as needed.   0  . VAGIFEM 10 MCG TABS vaginal tablet Place one tablet vaginally  with applicator two times  per week. (Patient taking differently: once weekly) 24 tablet 4  . zolpidem (AMBIEN) 5 MG tablet      No current facility-administered medications for this visit.    Family History  Problem Relation Age of Onset  . Hypertension Father     ROS:  Pertinent items are noted in HPI.  Otherwise, a comprehensive ROS was negative.  Exam:   BP 110/62 mmHg  Pulse 68  Resp 16  Ht 5' 5.75" (1.67 m)  Wt 139 lb (63.05 kg)  BMI 22.61 kg/m2  LMP 06/10/1995 Height: 5' 5.75" (167 cm) Ht Readings  from Last 3 Encounters:  10/26/15 5' 5.75" (1.67 m)  10/19/14 5' 6.25" (1.683 m)  09/14/13 5' 6.25" (1.683 m)    General appearance: alert, cooperative and appears stated age Head: Normocephalic, without obvious abnormality, atraumatic Neck: no adenopathy, supple, symmetrical, trachea midline and thyroid normal to inspection and palpation Lungs: clear to auscultation bilaterally Breasts: normal appearance, no masses or tenderness, No nipple retraction or dimpling, No nipple discharge or bleeding, No axillary or supraclavicular adenopathy Heart: regular rate and rhythm Abdomen: soft, non-tender; no masses,  no organomegaly Extremities: extremities normal, atraumatic, no cyanosis or edema Skin: Skin color, texture, turgor normal. No rashes  or lesions Lymph nodes: Cervical, supraclavicular, and axillary nodes normal. No abnormal inguinal nodes palpated Neurologic: Grossly normal   Pelvic: External genitalia:  no lesions              Urethra:  normal appearing urethra with no masses, tenderness or lesions              Bartholin's and Skene's: normal                 Vagina: normal appearing vagina with normal color and discharge, no lesions              Cervix: no cervical motion tenderness, no lesions and normal appearance.              Pap taken: No. Bimanual Exam:  Uterus:  uterus absent              Adnexa: normal adnexa and no mass, fullness, tenderness               Rectovaginal: Confirms               Anus:  normal sphincter tone, no lesions  Chaperone present: yes  A:  Well Woman with normal exam  Menopausal no HRT S/P TAH due to fibroids  Atrophic vaginitis with Vagifem use with good results, desires continuance  Cystocele grade 2-3, previous sling repair 1997, occasional stress incontinence  Hypertension/cholesterol management with PCP    P:   Reviewed health and wellness pertinent to exam  Discussed risks/benefits of Vagifem, warning signs.  Rx Vagifem see order  Discussed finding of cystocele, but good pelvic support with Kegel muscle. Patient do not feel this is a problem, no increase in pressure or discomfort or incomplete emptying. Discussed needs to advise if becomes symptomatic. Discussed pessary support if needed. Questions addressed.  Continue follow up with PCP as indicated.  Pap smear as above not taken today   counseled on breast self exam, mammography screening, adequate intake of calcium and vitamin D, diet and exercise  return annually or prn  An After Visit Summary was printed and given to the patient.

## 2015-10-26 NOTE — Patient Instructions (Signed)

## 2015-10-27 NOTE — Progress Notes (Signed)
Reviewed personally.  M. Suzanne Louanna Vanliew, MD.  

## 2015-12-25 ENCOUNTER — Other Ambulatory Visit: Payer: Self-pay | Admitting: Family Medicine

## 2015-12-25 DIAGNOSIS — Z1231 Encounter for screening mammogram for malignant neoplasm of breast: Secondary | ICD-10-CM

## 2016-01-25 ENCOUNTER — Ambulatory Visit
Admission: RE | Admit: 2016-01-25 | Discharge: 2016-01-25 | Disposition: A | Discharge status: Home / Self Care | Payer: 59 | Source: Ambulatory Visit | Attending: Family Medicine | Admitting: Family Medicine

## 2016-01-25 DIAGNOSIS — Z1231 Encounter for screening mammogram for malignant neoplasm of breast: Secondary | ICD-10-CM

## 2016-02-09 ENCOUNTER — Other Ambulatory Visit: Payer: Self-pay | Admitting: Nurse Practitioner

## 2016-02-12 NOTE — Telephone Encounter (Signed)
Medication refill request: Vagifem Last AEX:  10/26/15 DL Next AEX: 10/28/16 DL Last MMG (if hormonal medication request): 8/21/17BIRADS1:neg  Refill authorized: 02/05/15 #24tabs/4R to [facility].  Today #24tabs/4R?

## 2016-10-28 ENCOUNTER — Ambulatory Visit: Payer: 59 | Admitting: Certified Nurse Midwife

## 2016-10-30 ENCOUNTER — Ambulatory Visit: Payer: 59 | Admitting: Certified Nurse Midwife

## 2016-10-30 ENCOUNTER — Encounter: Payer: Self-pay | Admitting: Certified Nurse Midwife

## 2016-10-30 VITALS — BP 102/64 | HR 68 | Resp 16 | Ht 65.75 in | Wt 141.0 lb

## 2016-10-30 DIAGNOSIS — N952 Postmenopausal atrophic vaginitis: Secondary | ICD-10-CM

## 2016-10-30 DIAGNOSIS — Z01419 Encounter for gynecological examination (general) (routine) without abnormal findings: Secondary | ICD-10-CM

## 2016-10-30 DIAGNOSIS — N8110 Cystocele, unspecified: Secondary | ICD-10-CM

## 2016-10-30 DIAGNOSIS — I10 Essential (primary) hypertension: Secondary | ICD-10-CM | POA: Insufficient documentation

## 2016-10-30 MED ORDER — ESTRADIOL 10 MCG VA TABS: 10.0000 ug | ORAL_TABLET | VAGINAL | 12 refills | Status: DC

## 2016-10-30 NOTE — Progress Notes (Signed)
66 y.o. [id number] Divorced  Caucasian Fe here for annual exam. Menopausal no HRT. [name] working well for vaginal moisture. Using once weekly. Denies vaginal bleeding.Dominique Scott PCP every 6 months for hypertension, cholesterol/anxiety medication and labs. All stable per patient. Feels her cystocele has increased, but doing kegel exercise and yoga. No stress incontinence except for prolong laughing only. Had previous repair several years ago with [facility]. No other health issues today. Planning trip to [place]!  Patient's last menstrual period was 06/10/1995.          Sexually active: No.  The current method of family planning is status post hysterectomy.    Exercising: Yes.    aerobics & yoga Smoker:  no  Health Maintenance: Pap:  1997 neg History of Abnormal Pap: no MMG:  01-25-16 category b density birads 1:neg Self Breast exams: yes Colonoscopy:  2012 polyp f/u 23yrs, per dr Collene Mares she can go past 65yrs & they will contact her when due BMD:   2016 repeat 2-3 years PCP manages TDaP:  2011 Shingles: no Pneumonia: had done Hep C and HIV: HIV neg 31yrs ago Labs: none   reports that she has never smoked. She has never used smokeless tobacco. She reports that she drinks about 0.6 - 1.2 oz of alcohol per week . She reports that she does not use drugs.  Past Medical History:  Diagnosis Date  . Anxiety   . Depression   . Fibroid   . GERD (gastroesophageal reflux disease)   . Migraines     Past Surgical History:  Procedure Laterality Date  . ABDOMINAL HYSTERECTOMY  1997   TAH-fibroids  . INCONTINENCE SURGERY     bladder sling  . 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 ALPRAZolam (XANAX) 0.25 MG tablet Take 0.25 mg by mouth at bedtime as needed for sleep.    Marland Kitchen amLODipine (NORVASC) 5 MG tablet     . atorvastatin (LIPITOR) 10 MG tablet     . Calcium Citrate-Vitamin D (CALCIUM + D  PO) Take by mouth.    . cetirizine (ZYRTEC) 10 MG tablet Take 10 mg by mouth daily.    Marland Kitchen escitalopram (LEXAPRO) 10 MG tablet Take 10 mg by mouth.    Marland Kitchen lisinopril-hydrochlorothiazide (PRINZIDE,ZESTORETIC) 20-25 MG per tablet     . Misc Natural Products (GLUCOS-CHONDROIT-MSM COMPLEX PO) Take 1 tablet by mouth daily.    . mometasone (NASONEX) 50 MCG/ACT nasal spray Place 2 sprays into the nose as needed.     . potassium chloride SA (K-DUR,KLOR-CON) 20 MEQ tablet daily.    . rizatriptan (MAXALT) 10 MG tablet as needed.   0  . tretinoin (RETIN-A) 0.025 % cream   0  . [name] 10 MCG TABS vaginal tablet Place one tablet vaginally  with applicator two times  per week. 24 tablet 3  . zolpidem (AMBIEN) 5 MG tablet      No current facility-administered medications for this visit.     Family History  Problem Relation Age of Onset  . Hypertension Father     ROS:  Pertinent items are noted in HPI.  Otherwise, a comprehensive ROS was negative.  Exam:   BP 102/64   Pulse 68   Resp 16   Ht 5' 5.75" (1.67 m)   Wt 141 lb (64 kg)   LMP 06/10/1995   BMI 22.93 kg/m  Height: 5' 5.75" (167 cm) Ht  Readings from Last 3 Encounters:  10/30/16 5' 5.75" (1.67 m)  10/26/15 5' 5.75" (1.67 m)  10/19/14 5' 6.25" (1.683 m)    General appearance: alert, cooperative and appears stated age Head: Normocephalic, without obvious abnormality, atraumatic Neck: no adenopathy, supple, symmetrical, trachea midline and thyroid normal to inspection and palpation Lungs: clear to auscultation bilaterally Breasts: normal appearance, no masses or tenderness, No nipple retraction or dimpling, No nipple discharge or bleeding, No axillary or supraclavicular adenopathy Heart: regular rate and rhythm Abdomen: soft, non-tender; no masses,  no organomegaly Extremities: extremities normal, atraumatic, no cyanosis or edema Skin: Skin color, texture, turgor normal. No rashes or lesions Lymph nodes: Cervical, supraclavicular, and  axillary nodes normal. No abnormal inguinal nodes palpated Neurologic: Grossly normal   Pelvic: External genitalia:  no lesions              Urethra:  normal appearing urethra with no masses, tenderness or lesions              Bartholin's and Skene's: normal                 Vagina: normal appearing vagina with normal color and discharge, no lesions              Cervix: absent              Pap taken: No. Bimanual Exam:  Uterus:  uterus absent              Adnexa: normal adnexa and no mass, fullness, tenderness               Rectovaginal: Confirms               Anus:  normal sphincter tone, no lesions  Chaperone present: yes  A:  Well Woman with normal exam  Menopausal no HRT S/P TAH for fibroids, ovaries retained  Cystocele grade 2 previous sling repair in 1997 occasional stress incontinence, working with kegels and yoga  Atrophic vaginitis [name] working well, desires continuance  Cholesterol, hypertension/anxiety management with PCP  P:   Reviewed health and wellness pertinent to exam  Discussed slight increase in cystocele noted, but good pelvic support. Continue with kegel exercise and advise if changes in symptoms.  Discussed risks and benefits/warning signs and expectations of vaginal estrogen. Desires continuation  Rx [name] see order with instructions  Pap smear: no  counseled on breast self exam, mammography screening, adequate intake of calcium and vitamin D, diet and exercise  return annually or prn  An After Visit Summary was printed and given to the patient.

## 2016-10-30 NOTE — Patient Instructions (Signed)

## 2016-12-15 ENCOUNTER — Other Ambulatory Visit: Payer: Self-pay | Admitting: Family Medicine

## 2016-12-15 DIAGNOSIS — Z1231 Encounter for screening mammogram for malignant neoplasm of breast: Secondary | ICD-10-CM

## 2017-01-13 ENCOUNTER — Encounter: Payer: Self-pay | Admitting: Family Medicine

## 2017-01-13 ENCOUNTER — Ambulatory Visit (INDEPENDENT_AMBULATORY_CARE_PROVIDER_SITE_OTHER): Payer: 59 | Admitting: Family Medicine

## 2017-01-13 VITALS — BP 110/70 | Ht 66.0 in | Wt 138.0 lb

## 2017-01-13 DIAGNOSIS — M545 Low back pain, unspecified: Secondary | ICD-10-CM

## 2017-01-13 DIAGNOSIS — M4120 Other idiopathic scoliosis, site unspecified: Secondary | ICD-10-CM

## 2017-01-13 MED ORDER — MELOXICAM 15 MG PO TABS: 15.0000 mg | ORAL_TABLET | Freq: Every day | ORAL | 0 refills | Status: DC

## 2017-01-13 NOTE — Progress Notes (Signed)
    CHIEF COMPLAINT / HPI:   2 weeks of posterior right hip and low back pain. Started acutely but unclear what activity triggered it as nothing new.Pain originates posterior right hip, wraps around to front. Has improved slightly in last few days but still aggravating.Worseif bending over, raising leg forward. Does not radiate into thigh or down leg. No weakness. No urinary or stool incontinence. No skin lesions or fever. Never had it before.  REVIEW OF SYSTEMS:   see HPI.  PERTINENT  PMH / PSH: I have reviewed the patient's medications, allergies, past medical and surgical history, smoking status.  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  OBJECTIVE:  Vital signs are reviewed.    WDWNNAD BACK Lumbar scoliosis  Forward flexion and hyperextension FROm and painless. HIPS: B FROM IR/ER and painless. Mild ttp over right SI joint area but negative compression test, normal FABER/FADIR. Mild weakness Right hip ABductors c/w left and she has some pain with this movement. SKIN: area of low back and hip reveals no lesions or rash, no unusual erythema EXT: very flexible and 5/5 strength B = strength flexion and extension knee/hip/ankle NEURO: SLR normal in seated and supine positions.  ASSESSMENT / PLAN: 1. Posterior hip and low back pain 2 weeks 2. Mild right abductor weakness 3. 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 Will try 2 weeks antiinflammatory, get lumbar films, start hip ABductor exercises and f/u 2 weeks

## 2017-01-13 NOTE — Patient Instructions (Addendum)
[facility] - [address], [place] [address] Hulan Saas [phone]  I am calling in a rx for Mobic (meloxicam)--take one a day with food. Let's plan on two weeks of therapy.  I have given you some exercises to do once or twice a day.  I have sent in an order for X rays of your back Unless there is something unexpected on them I will go over them when I see you back. Let me see you in about 2 weeks. Please call sooner with any questions or problems. Great to meet you!

## 2017-01-14 ENCOUNTER — Ambulatory Visit
Admission: RE | Admit: 2017-01-14 | Discharge: 2017-01-14 | Disposition: A | Discharge status: Home / Self Care | Payer: 59 | Source: Ambulatory Visit | Attending: Family Medicine | Admitting: Family Medicine

## 2017-01-14 DIAGNOSIS — M545 Low back pain, unspecified: Secondary | ICD-10-CM

## 2017-01-14 DIAGNOSIS — M4120 Other idiopathic scoliosis, site unspecified: Secondary | ICD-10-CM

## 2017-01-20 ENCOUNTER — Encounter: Payer: Self-pay | Admitting: Family Medicine

## 2017-01-26 ENCOUNTER — Encounter: Payer: Self-pay | Admitting: Sports Medicine

## 2017-01-26 ENCOUNTER — Ambulatory Visit
Admission: RE | Admit: 2017-01-26 | Discharge: 2017-01-26 | Disposition: A | Discharge status: Home / Self Care | Payer: 59 | Source: Ambulatory Visit | Attending: Family Medicine | Admitting: Family Medicine

## 2017-01-26 ENCOUNTER — Ambulatory Visit (INDEPENDENT_AMBULATORY_CARE_PROVIDER_SITE_OTHER): Payer: 59 | Admitting: Sports Medicine

## 2017-01-26 DIAGNOSIS — M545 Low back pain: Secondary | ICD-10-CM | POA: Diagnosis not present

## 2017-01-26 DIAGNOSIS — Z1231 Encounter for screening mammogram for malignant neoplasm of breast: Secondary | ICD-10-CM

## 2017-01-26 DIAGNOSIS — M549 Dorsalgia, unspecified: Secondary | ICD-10-CM | POA: Insufficient documentation

## 2017-01-26 NOTE — Progress Notes (Signed)
   [facility] [address] [place], [place] [address] Phone: [phone] Fax: [phone]   Patient Name: MARIBELL DEMEO Date of Birth: [birth year] Medical Record Number: [id number] Gender: female Date of Encounter: 01/26/2017  History of Present Illness:  ELLE VEZINA is a 66 y.o. very pleasant female patient who presents with the following:  Low back pain: Patient seen earlier this month for 2 wk h/o right-sided low back pain. Lumbar imaging at the time showed severe levoscoliosis at L1-2 with corresponding significant disc space narrowing and multilevel fact arthropathy throughout lumbar spine. Plan at the time was for use of Mobix daily and abductor strengthening exercises for 2 weeks. Patient presents today with resolution of pain with above intervention. She denies leg weakness or numbness. Denies bowel or bladder incontinence.  Past Medical, Surgical, Social, and Family History Reviewed. Medications and Allergies reviewed and all updated if necessary.  Review of Systems:  Per HPI  Physical Examination: Vitals:   01/26/17 1618  BP: 118/82   Vitals:   01/26/17 1618  Weight: 138 lb (62.6 kg)  Height: 5' 6.5" (1.689 m)   Body mass index is 21.94 kg/m.  Constitutional: NAD, pleasant CV: warm extremities, pulses intact MSK: lumbar levoscoliosis, no tenderness to palpation of her spine/SI/paraspinal musculature. Hip abduction 5/5 bilaterally; hip flexion 5/5 bilaterally. Gait normal.   Assessment and Plan:  Right sided back pain/hip pain: Resolved with exercises and mobic. Lumbar xrays show significant DJD which was likely contributing to her pain. We discussed the general course of arthritis and possible future flares.  Plan: --PRN mobic for pain --advised cushioned shoe for job --f/u PRN  Alphonzo Grieve, MD   Patient seen and evaluated with the resident. I agree with the above plan of care. Patient is doing much better. Follow-up  as needed.

## 2017-01-26 NOTE — Assessment & Plan Note (Signed)
Resolved with exercises and mobic. Lumbar xrays show significant DJD which was likely contributing to her pain. We discussed the general course of arthritis and possible future flares.  Plan: --PRN mobic for pain --advised cushioned shoe for job --f/u PRN

## 2017-02-11 ENCOUNTER — Ambulatory Visit: Payer: 59 | Admitting: Family Medicine

## 2017-02-12 ENCOUNTER — Ambulatory Visit: Payer: 59 | Admitting: Family Medicine

## 2017-11-03 ENCOUNTER — Ambulatory Visit: Payer: 59 | Admitting: Certified Nurse Midwife

## 2017-11-04 ENCOUNTER — Ambulatory Visit: Payer: 59 | Admitting: Certified Nurse Midwife

## 2017-11-04 ENCOUNTER — Encounter: Payer: Self-pay | Admitting: Certified Nurse Midwife

## 2017-11-04 ENCOUNTER — Other Ambulatory Visit: Payer: Self-pay

## 2017-11-04 VITALS — BP 110/62 | HR 64 | Resp 16 | Ht 66.0 in | Wt 147.0 lb

## 2017-11-04 DIAGNOSIS — N952 Postmenopausal atrophic vaginitis: Secondary | ICD-10-CM

## 2017-11-04 DIAGNOSIS — Z780 Asymptomatic menopausal state: Secondary | ICD-10-CM

## 2017-11-04 DIAGNOSIS — Z659 Problem related to unspecified psychosocial circumstances: Secondary | ICD-10-CM | POA: Diagnosis not present

## 2017-11-04 DIAGNOSIS — Z01419 Encounter for gynecological examination (general) (routine) without abnormal findings: Secondary | ICD-10-CM

## 2017-11-04 MED ORDER — ESTRADIOL 10 MCG VA TABS: 10.0000 ug | ORAL_TABLET | VAGINAL | 12 refills | Status: DC

## 2017-11-04 NOTE — Patient Instructions (Signed)

## 2017-11-04 NOTE — Progress Notes (Signed)
67 y.o. [id number] Divorced  Caucasian Fe here for annual exam. Menopausal occasional hot flash. Denies vaginal bleeding or vaginal dryness. Sees PCP Darcus Austin twice yearly for hypertension/cholesterol/anxiety, all stable. Social stress with both parents passing away last year. Emotionally doing well. Vagifem working well for vaginal dryness. No health issues today. Spent Mother's day in [place] with son!  Patient's last menstrual period was 06/10/1995.          Sexually active: No.  The current method of family planning is status post hysterectomy.    Exercising: Yes.    Personal trainer Smoker:  no  Health Maintenance: Pap:  1997 neg History of Abnormal Pap: no MMG:  01-26-17 category b density birads 1:neg Self Breast exams: no Colonoscopy:  2012 f/u 7rys BMD:   2016 f/u 2-3 yrs PCP manages TDaP:  2018 Shingles: no Pneumonia: no Hep C and HIV: HIV neg 13 yrs ago Labs: no   reports that she has never smoked. She has never used smokeless tobacco. She reports that she drinks about 0.6 oz of alcohol per week. She reports that she does not use drugs.  Past Medical History:  Diagnosis Date  . Anxiety   . Depression   . Fibroid   . GERD (gastroesophageal reflux disease)   . Migraines     Past Surgical History:  Procedure Laterality Date  . ABDOMINAL HYSTERECTOMY  1997   TAH-fibroids  . INCONTINENCE SURGERY     bladder sling  . 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 ALPRAZolam (XANAX) 0.25 MG tablet Take 0.25 mg by mouth at bedtime as needed for sleep.    Marland Kitchen amLODipine (NORVASC) 5 MG tablet     . atorvastatin (LIPITOR) 10 MG tablet     . Calcium Citrate-Vitamin D (CALCIUM + D PO) Take by mouth.    . cetirizine (ZYRTEC) 10 MG tablet Take 10 mg by mouth daily.    Marland Kitchen escitalopram (LEXAPRO) 10 MG tablet Take 10 mg by mouth.    . Estradiol ([name]) 10 MCG TABS vaginal tablet Place 1  tablet (10 mcg total) vaginally 2 (two) times a week. 8 tablet 12  . lisinopril-hydrochlorothiazide (PRINZIDE,ZESTORETIC) 20-25 MG per tablet     . meloxicam (MOBIC) 15 MG tablet Take 1 tablet (15 mg total) by mouth daily. 30 tablet 0  . mometasone (NASONEX) 50 MCG/ACT nasal spray Place 2 sprays into the nose as needed.     . potassium chloride SA (K-DUR,KLOR-CON) 20 MEQ tablet daily.    Marland Kitchen tretinoin (RETIN-A) 0.025 % cream   0  . zolpidem (AMBIEN) 5 MG tablet      No current facility-administered medications for this visit.     Family History  Problem Relation Age of Onset  . Hypertension Father     ROS:  Pertinent items are noted in HPI.  Otherwise, a comprehensive ROS was negative.  Exam:   BP 110/62   Pulse 64   Resp 16   Ht 5\' 6"  (1.676 m)   Wt 147 lb (66.7 kg)   LMP 06/10/1995   BMI 23.73 kg/m  Height: 5\' 6"  (167.6 cm) Ht Readings from Last 3 Encounters:  11/04/17 5\' 6"  (1.676 m)  01/26/17 5' 6.5" (1.689 m)  01/13/17 5\' 6"  (1.676 m)    General appearance: alert, cooperative and appears stated age Head: Normocephalic, without obvious abnormality, atraumatic Neck: no adenopathy, supple, symmetrical, trachea midline and  thyroid normal to inspection and palpation Lungs: clear to auscultation bilaterally Breasts: normal appearance, no masses or tenderness, No nipple retraction or dimpling, No nipple discharge or bleeding, No axillary or supraclavicular adenopathy Heart: regular rate and rhythm Abdomen: soft, non-tender; no masses,  no organomegaly Extremities: extremities normal, atraumatic, no cyanosis or edema Skin: Skin color, texture, turgor normal. No rashes or lesions Lymph nodes: Cervical, supraclavicular, and axillary nodes normal. No abnormal inguinal nodes palpated Neurologic: Grossly normal   Pelvic: External genitalia:  no lesions              Urethra:  normal appearing urethra with no masses, tenderness or lesions              Bartholin's and Skene's:  normal                 Vagina: normal appearing vagina with normal color and discharge, no lesions              Cervix: absent              Pap taken: No. Bimanual Exam:  Uterus:  uterus absent              Adnexa: normal adnexa and no mass, fullness, tenderness               Rectovaginal: Confirms               Anus:  normal sphincter tone, no lesions  Chaperone present: yes  A:  Well Woman with normal exam  Menopausal no HRT  Atrophic vaginitis [name] working well  Social stress with parents death  Cholesterol, hypertension,anxiety with MD management  P:   Reviewed health and wellness pertinent to exam  Discussed risks/benefits/warning signs with [name] use. Desires continuance  Rx [name] see order with instructions  Encouraged to seek friend and family support as needed.  Continue with PCP follow up as indicated  Pap smear: no   counseled on breast self exam, mammography screening, feminine hygiene, adequate intake of calcium and vitamin D, diet and exercise  return annually or prn  An After Visit Summary was printed and given to the patient.

## 2017-12-16 ENCOUNTER — Other Ambulatory Visit: Payer: Self-pay | Admitting: Family Medicine

## 2017-12-16 DIAGNOSIS — Z1231 Encounter for screening mammogram for malignant neoplasm of breast: Secondary | ICD-10-CM

## 2018-01-27 ENCOUNTER — Ambulatory Visit
Admission: RE | Admit: 2018-01-27 | Discharge: 2018-01-27 | Disposition: A | Discharge status: Home / Self Care | Payer: 59 | Source: Ambulatory Visit | Attending: Family Medicine | Admitting: Family Medicine

## 2018-01-27 ENCOUNTER — Other Ambulatory Visit: Payer: Self-pay | Admitting: Family Medicine

## 2018-01-27 DIAGNOSIS — Z1231 Encounter for screening mammogram for malignant neoplasm of breast: Secondary | ICD-10-CM

## 2018-04-28 DIAGNOSIS — I10 Essential (primary) hypertension: Secondary | ICD-10-CM | POA: Diagnosis not present

## 2018-04-28 DIAGNOSIS — E7800 Pure hypercholesterolemia, unspecified: Secondary | ICD-10-CM | POA: Diagnosis not present

## 2018-04-28 DIAGNOSIS — Z1159 Encounter for screening for other viral diseases: Secondary | ICD-10-CM | POA: Diagnosis not present

## 2018-04-28 DIAGNOSIS — M8588 Other specified disorders of bone density and structure, other site: Secondary | ICD-10-CM | POA: Diagnosis not present

## 2018-04-28 DIAGNOSIS — R69 Illness, unspecified: Secondary | ICD-10-CM | POA: Diagnosis not present

## 2018-04-28 DIAGNOSIS — K219 Gastro-esophageal reflux disease without esophagitis: Secondary | ICD-10-CM | POA: Diagnosis not present

## 2018-04-28 DIAGNOSIS — Z0000 Encounter for general adult medical examination without abnormal findings: Secondary | ICD-10-CM | POA: Diagnosis not present

## 2018-04-28 DIAGNOSIS — G4700 Insomnia, unspecified: Secondary | ICD-10-CM | POA: Diagnosis not present

## 2018-04-28 DIAGNOSIS — Z23 Encounter for immunization: Secondary | ICD-10-CM | POA: Diagnosis not present

## 2018-05-05 ENCOUNTER — Other Ambulatory Visit: Payer: Self-pay | Admitting: Family Medicine

## 2018-05-05 DIAGNOSIS — M8580 Other specified disorders of bone density and structure, unspecified site: Secondary | ICD-10-CM

## 2018-05-26 DIAGNOSIS — Z23 Encounter for immunization: Secondary | ICD-10-CM | POA: Diagnosis not present

## 2018-06-15 ENCOUNTER — Ambulatory Visit
Admission: RE | Admit: 2018-06-15 | Discharge: 2018-06-15 | Disposition: A | Discharge status: Home / Self Care | Payer: 59 | Source: Ambulatory Visit | Attending: Family Medicine | Admitting: Family Medicine

## 2018-06-15 DIAGNOSIS — M8589 Other specified disorders of bone density and structure, multiple sites: Secondary | ICD-10-CM | POA: Diagnosis not present

## 2018-06-15 DIAGNOSIS — M8580 Other specified disorders of bone density and structure, unspecified site: Secondary | ICD-10-CM

## 2018-06-15 DIAGNOSIS — Z780 Asymptomatic menopausal state: Secondary | ICD-10-CM | POA: Diagnosis not present

## 2018-06-22 ENCOUNTER — Other Ambulatory Visit: Payer: Self-pay | Admitting: Certified Nurse Midwife

## 2018-06-22 ENCOUNTER — Telehealth: Payer: Self-pay | Admitting: Certified Nurse Midwife

## 2018-06-22 DIAGNOSIS — N952 Postmenopausal atrophic vaginitis: Secondary | ICD-10-CM

## 2018-06-22 MED ORDER — ESTRADIOL 10 MCG VA TABS: 10.0000 ug | ORAL_TABLET | VAGINAL | 2 refills | Status: DC

## 2018-06-22 NOTE — Telephone Encounter (Signed)
Vagifem 37mcg insert: patient needs new Rx in 3 month increments due to new insurance.  New pharmacy: [facility]

## 2018-06-22 NOTE — Telephone Encounter (Signed)
Medication refill request: [name] Last AEX:  11/04/17 DL Next AEX: 11/10/18 Last MMG (if hormonal medication request): 01/27/18 BIRADS 1 negative/density c Refill authorized: 11/05/17 #8 w/12 refills  Please advise today. Patient is requesting a 3 month supply. Order has been pended to last patient until her AEX.

## 2018-06-22 NOTE — Telephone Encounter (Signed)
Patient called and said she thought she saw her DOB was incorrect on her recent bone density test done at the [facility]. I requested she take a screen shot with the information because I've confirmed her DOB is correct in our system and all recent results and in demographics.  Additionally, I called [facility] and spoke with [facility] who confirm the patient's DOB is correct as well. Patient appreciative. Closing encounter as no further action is needed.

## 2018-06-22 NOTE — Telephone Encounter (Signed)
Patient sent a message through [facility] requesting to confirm her date of birth is correct in our system. I called her and a message to call the office and ask for [place]. I also sent her a message back through [place].

## 2018-07-22 DIAGNOSIS — H0011 Chalazion right upper eyelid: Secondary | ICD-10-CM | POA: Diagnosis not present

## 2018-07-27 DIAGNOSIS — H0011 Chalazion right upper eyelid: Secondary | ICD-10-CM | POA: Diagnosis not present

## 2018-08-18 DIAGNOSIS — R69 Illness, unspecified: Secondary | ICD-10-CM | POA: Diagnosis not present

## 2018-10-25 DIAGNOSIS — R69 Illness, unspecified: Secondary | ICD-10-CM | POA: Diagnosis not present

## 2018-10-29 DIAGNOSIS — R69 Illness, unspecified: Secondary | ICD-10-CM | POA: Diagnosis not present

## 2018-10-29 DIAGNOSIS — G4700 Insomnia, unspecified: Secondary | ICD-10-CM | POA: Diagnosis not present

## 2018-10-29 DIAGNOSIS — E7800 Pure hypercholesterolemia, unspecified: Secondary | ICD-10-CM | POA: Diagnosis not present

## 2018-10-29 DIAGNOSIS — I10 Essential (primary) hypertension: Secondary | ICD-10-CM | POA: Diagnosis not present

## 2018-11-10 ENCOUNTER — Ambulatory Visit: Payer: 59 | Admitting: Certified Nurse Midwife

## 2018-11-10 DIAGNOSIS — E7800 Pure hypercholesterolemia, unspecified: Secondary | ICD-10-CM | POA: Diagnosis not present

## 2018-11-10 DIAGNOSIS — E559 Vitamin D deficiency, unspecified: Secondary | ICD-10-CM | POA: Diagnosis not present

## 2018-11-10 DIAGNOSIS — Z79899 Other long term (current) drug therapy: Secondary | ICD-10-CM | POA: Diagnosis not present

## 2018-11-11 DIAGNOSIS — L821 Other seborrheic keratosis: Secondary | ICD-10-CM | POA: Diagnosis not present

## 2018-11-11 DIAGNOSIS — D2372 Other benign neoplasm of skin of left lower limb, including hip: Secondary | ICD-10-CM | POA: Diagnosis not present

## 2018-11-11 DIAGNOSIS — L812 Freckles: Secondary | ICD-10-CM | POA: Diagnosis not present

## 2018-11-11 DIAGNOSIS — B078 Other viral warts: Secondary | ICD-10-CM | POA: Diagnosis not present

## 2018-11-11 DIAGNOSIS — Z85828 Personal history of other malignant neoplasm of skin: Secondary | ICD-10-CM | POA: Diagnosis not present

## 2018-11-11 DIAGNOSIS — D1801 Hemangioma of skin and subcutaneous tissue: Secondary | ICD-10-CM | POA: Diagnosis not present

## 2018-11-19 ENCOUNTER — Other Ambulatory Visit: Payer: Self-pay

## 2018-11-23 ENCOUNTER — Ambulatory Visit (INDEPENDENT_AMBULATORY_CARE_PROVIDER_SITE_OTHER): Payer: Medicare HMO | Admitting: Certified Nurse Midwife

## 2018-11-23 ENCOUNTER — Other Ambulatory Visit: Payer: Self-pay

## 2018-11-23 ENCOUNTER — Encounter: Payer: Self-pay | Admitting: Certified Nurse Midwife

## 2018-11-23 VITALS — BP 110/74 | HR 72 | Temp 97.2°F | Resp 16 | Ht 65.75 in | Wt 149.0 lb

## 2018-11-23 DIAGNOSIS — N952 Postmenopausal atrophic vaginitis: Secondary | ICD-10-CM | POA: Diagnosis not present

## 2018-11-23 DIAGNOSIS — N8110 Cystocele, unspecified: Secondary | ICD-10-CM | POA: Diagnosis not present

## 2018-11-23 DIAGNOSIS — Z01419 Encounter for gynecological examination (general) (routine) without abnormal findings: Secondary | ICD-10-CM

## 2018-11-23 MED ORDER — ESTRADIOL 10 MCG VA TABS: 10.0000 ug | ORAL_TABLET | VAGINAL | 3 refills | Status: DC

## 2018-11-23 NOTE — Progress Notes (Signed)
68 y.o. [id number] Divorced  Caucasian Fe here for annual exam. Menopausal no HRT. Denies vaginal bleeding. Doing pelvic floor therapy with PT now. Working on general endurance of pelvic floor and feel this has helped with bladder prolapse. Using [name] once weekly for dryness with no problems. No urinary leakage issues at present.  Sees PCP for anxiety, cholesterol, hypertension, labs and aex. All medications stable at present. No health issues today.  Patient's last menstrual period was 06/10/1995.          Sexually active: No.  The current method of family planning is status post hysterectomy.    Exercising: Yes.    aerobics, walking, yoga Smoker:  no  Review of Systems  Constitutional: Negative.   HENT: Negative.   Eyes: Negative.   Respiratory: Negative.   Cardiovascular: Negative.   Gastrointestinal: Negative.   Genitourinary: Negative.   Musculoskeletal: Negative.   Skin: Negative.   Neurological: Negative.   Endo/Heme/Allergies: Negative.   Psychiatric/Behavioral: Negative.     Health Maintenance: Pap:  1997 neg History of Abnormal Pap: no MMG:  01-27-18 category c density birads 1:neg Self Breast exams: yes Colonoscopy:  2019 f/u 53yrs per patient, polyp BMD:   2020 PCP manages TDaP:  2018 Shingles: 2020 Pneumonia: had done Hep C and HIV: HIV neg 29yrs ago per patient Labs: yes   reports that she has never smoked. She has never used smokeless tobacco. She reports current alcohol use of about 2.0 standard drinks of alcohol per week. She reports that she does not use drugs.  Past Medical History:  Diagnosis Date  . Anxiety   . Depression   . Fibroid   . GERD (gastroesophageal reflux disease)   . Migraines     Past Surgical History:  Procedure Laterality Date  . ABDOMINAL HYSTERECTOMY  1997   TAH-fibroids  . INCONTINENCE SURGERY     bladder sling  . 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 ALPRAZolam (XANAX) 0.25 MG tablet Take 0.25 mg by mouth at bedtime as needed for sleep.    Marland Kitchen amLODipine (NORVASC) 5 MG tablet     . atorvastatin (LIPITOR) 10 MG tablet     . Calcium Citrate-Vitamin D (CALCIUM + D PO) Take by mouth.    . cetirizine (ZYRTEC) 10 MG tablet Take 10 mg by mouth daily.    Marland Kitchen escitalopram (LEXAPRO) 10 MG tablet Take 10 mg by mouth.    . Estradiol ([name]) 10 MCG TABS vaginal tablet Place 1 tablet (10 mcg total) vaginally 2 (two) times a week. 24 tablet 2  . lisinopril-hydrochlorothiazide (PRINZIDE,ZESTORETIC) 20-25 MG per tablet     . mometasone (NASONEX) 50 MCG/ACT nasal spray Place 2 sprays into the nose as needed.     . potassium chloride SA (K-DUR,KLOR-CON) 20 MEQ tablet daily.    Marland Kitchen tretinoin (RETIN-A) 0.025 % cream   0   No current facility-administered medications for this visit.     Family History  Problem Relation Age of Onset  . Hypertension Father     ROS:  Pertinent items are noted in HPI.  Otherwise, a comprehensive ROS was negative.  Exam:   BP 110/74   Pulse 72   Temp (!) 97.2 F (36.2 C) (Skin)   Resp 16   Ht 5' 5.75" (1.67 m)   Wt 149 lb (67.6 kg)   LMP 06/10/1995   BMI 24.23 kg/m  Height: 5'  5.75" (167 cm) Ht Readings from Last 3 Encounters:  11/23/18 5' 5.75" (1.67 m)  11/04/17 5\' 6"  (1.676 m)  01/26/17 5' 6.5" (1.689 m)    General appearance: alert, cooperative and appears stated age Head: Normocephalic, without obvious abnormality, atraumatic Neck: no adenopathy, supple, symmetrical, trachea midline and thyroid normal to inspection and palpation Lungs: clear to auscultation bilaterally Breasts: normal appearance, no masses or tenderness, No nipple retraction or dimpling, No nipple discharge or bleeding, No axillary or supraclavicular adenopathy Heart: regular rate and rhythm Abdomen: soft, non-tender; no masses,  no organomegaly Extremities: extremities normal, atraumatic, no cyanosis or edema Skin:  Skin color, texture, turgor normal. No rashes or lesions Lymph nodes: Cervical, supraclavicular, and axillary nodes normal. No abnormal inguinal nodes palpated Neurologic: Grossly normal   Pelvic: External genitalia:  no lesions              Urethra:  normal appearing urethra with no masses, tenderness or lesions              Bartholin's and Skene's: normal                 Vagina: normal appearing vagina with normal color and discharge, no lesions, good  Moisture noted, cystocele grade 3  Noted, patient aware              Cervix: absent              Pap taken: No. Bimanual Exam:  Uterus:  uterus absent              Adnexa: normal adnexa and no mass, fullness, tenderness               Rectovaginal: Confirms               Anus:  normal sphincter tone, no lesions  Chaperone present: yes  A:  Well Woman with normal exam  Post  Menopausal no HRT s/pTAH with ovaries retained  Atrophic vaginitis using [name] with good results  Cystocele grade 3 known, previous bladder prolapse surgery  Hypertension, cholesterol, anxiety with PCP management, all stable  P:   Reviewed health and wellness pertinent to exam  Aware of need to advise if vaginal bleeding to call  Risks/benefits/warning signs with [name] reviewed, desires continuance  Rx [name] see order with instructions  Continue with pelvic floor PT  Continue follow up with PCP as indicated  Pap smear: no   counseled on breast self exam, mammography screening, feminine hygiene, adequate intake of calcium and vitamin D, diet and exercise, Kegel's exercises  return annually or prn  An After Visit Summary was printed and given to the patient.

## 2018-12-09 DIAGNOSIS — Z1159 Encounter for screening for other viral diseases: Secondary | ICD-10-CM | POA: Diagnosis not present

## 2018-12-22 DIAGNOSIS — M79674 Pain in right toe(s): Secondary | ICD-10-CM | POA: Diagnosis not present

## 2019-01-18 ENCOUNTER — Other Ambulatory Visit: Payer: Self-pay | Admitting: Family Medicine

## 2019-01-18 DIAGNOSIS — Z1231 Encounter for screening mammogram for malignant neoplasm of breast: Secondary | ICD-10-CM

## 2019-01-21 DIAGNOSIS — M79674 Pain in right toe(s): Secondary | ICD-10-CM | POA: Diagnosis not present

## 2019-02-03 DIAGNOSIS — Z85828 Personal history of other malignant neoplasm of skin: Secondary | ICD-10-CM | POA: Diagnosis not present

## 2019-02-03 DIAGNOSIS — L821 Other seborrheic keratosis: Secondary | ICD-10-CM | POA: Diagnosis not present

## 2019-02-03 DIAGNOSIS — L820 Inflamed seborrheic keratosis: Secondary | ICD-10-CM | POA: Diagnosis not present

## 2019-02-18 ENCOUNTER — Ambulatory Visit
Admission: RE | Admit: 2019-02-18 | Discharge: 2019-02-18 | Disposition: A | Discharge status: Home / Self Care | Payer: Medicare HMO | Source: Ambulatory Visit | Attending: Family Medicine | Admitting: Family Medicine

## 2019-02-18 ENCOUNTER — Other Ambulatory Visit: Payer: Self-pay

## 2019-02-18 DIAGNOSIS — Z1231 Encounter for screening mammogram for malignant neoplasm of breast: Secondary | ICD-10-CM | POA: Diagnosis not present

## 2019-02-24 DIAGNOSIS — Z23 Encounter for immunization: Secondary | ICD-10-CM | POA: Diagnosis not present

## 2019-03-02 DIAGNOSIS — R69 Illness, unspecified: Secondary | ICD-10-CM | POA: Diagnosis not present

## 2019-03-10 ENCOUNTER — Other Ambulatory Visit: Payer: Self-pay | Admitting: Emergency Medicine

## 2019-03-10 DIAGNOSIS — Z20822 Contact with and (suspected) exposure to covid-19: Secondary | ICD-10-CM

## 2019-03-10 DIAGNOSIS — Z20828 Contact with and (suspected) exposure to other viral communicable diseases: Secondary | ICD-10-CM | POA: Diagnosis not present

## 2019-03-11 LAB — NOVEL CORONAVIRUS, NAA: SARS-CoV-2, NAA: NOT DETECTED

## 2019-03-11 LAB — SPECIMEN STATUS REPORT

## 2019-03-30 DIAGNOSIS — H52203 Unspecified astigmatism, bilateral: Secondary | ICD-10-CM | POA: Diagnosis not present

## 2019-03-30 DIAGNOSIS — H2513 Age-related nuclear cataract, bilateral: Secondary | ICD-10-CM | POA: Diagnosis not present

## 2019-04-08 DIAGNOSIS — H60591 Other noninfective acute otitis externa, right ear: Secondary | ICD-10-CM | POA: Diagnosis not present

## 2019-05-23 DIAGNOSIS — R69 Illness, unspecified: Secondary | ICD-10-CM | POA: Diagnosis not present

## 2019-06-24 DIAGNOSIS — E559 Vitamin D deficiency, unspecified: Secondary | ICD-10-CM | POA: Diagnosis not present

## 2019-06-24 DIAGNOSIS — I10 Essential (primary) hypertension: Secondary | ICD-10-CM | POA: Diagnosis not present

## 2019-06-29 DIAGNOSIS — E7800 Pure hypercholesterolemia, unspecified: Secondary | ICD-10-CM | POA: Diagnosis not present

## 2019-06-29 DIAGNOSIS — R69 Illness, unspecified: Secondary | ICD-10-CM | POA: Diagnosis not present

## 2019-06-29 DIAGNOSIS — I10 Essential (primary) hypertension: Secondary | ICD-10-CM | POA: Diagnosis not present

## 2019-06-29 DIAGNOSIS — Z0000 Encounter for general adult medical examination without abnormal findings: Secondary | ICD-10-CM | POA: Diagnosis not present

## 2019-06-29 DIAGNOSIS — K219 Gastro-esophageal reflux disease without esophagitis: Secondary | ICD-10-CM | POA: Diagnosis not present

## 2019-06-29 DIAGNOSIS — E559 Vitamin D deficiency, unspecified: Secondary | ICD-10-CM | POA: Diagnosis not present

## 2019-07-07 ENCOUNTER — Ambulatory Visit: Payer: Medicare HMO

## 2019-07-12 ENCOUNTER — Ambulatory Visit: Payer: Medicare HMO

## 2019-07-16 ENCOUNTER — Ambulatory Visit: Payer: Medicare HMO | Attending: Internal Medicine

## 2019-07-16 DIAGNOSIS — Z23 Encounter for immunization: Secondary | ICD-10-CM | POA: Insufficient documentation

## 2019-07-16 NOTE — Progress Notes (Signed)
   Covid-19 Vaccination Clinic  Name:  Dominique Scott    MRN: [id number] DOB: 1950-07-04  07/16/2019  [name] was observed post Covid-19 immunization for 15 minutes without incidence. She was provided with Vaccine Information Sheet and instruction to access the V-Safe system.   Ms. Paper was instructed to call 911 with any severe reactions post vaccine: Marland Kitchen Difficulty breathing  . Swelling of your face and throat  . A fast heartbeat  . A bad rash all over your body  . Dizziness and weakness    Immunizations Administered    Name Date Dose VIS Date Route   Pfizer COVID-19 Vaccine 07/16/2019 12:26 PM 0.3 mL 05/20/2019 Intramuscular   Manufacturer: [place]   Lot: [id number]   [place]: [id number]

## 2019-08-10 ENCOUNTER — Ambulatory Visit: Payer: Medicare HMO | Attending: Internal Medicine

## 2019-08-10 DIAGNOSIS — Z23 Encounter for immunization: Secondary | ICD-10-CM | POA: Insufficient documentation

## 2019-08-10 NOTE — Progress Notes (Signed)
   Covid-19 Vaccination Clinic  Name:  Dominique Scott    MRN: [id number] DOB: 1951/04/20  08/10/2019  [name] was observed post Covid-19 immunization for 15 minutes without incident. She was provided with Vaccine Information Sheet and instruction to access the V-Safe system.   [name] was instructed to call 911 with any severe reactions post vaccine: Marland Kitchen Difficulty breathing  . Swelling of face and throat  . A fast heartbeat  . A bad rash all over body  . Dizziness and weakness   Immunizations Administered    Name Date Dose VIS Date Route   Pfizer COVID-19 Vaccine 08/10/2019 12:55 PM 0.3 mL 05/20/2019 Intramuscular   Manufacturer: [place]   Lot: [id number]   [place]: [id number]

## 2019-08-16 DIAGNOSIS — R21 Rash and other nonspecific skin eruption: Secondary | ICD-10-CM | POA: Diagnosis not present

## 2019-08-30 ENCOUNTER — Encounter: Payer: Self-pay | Admitting: Certified Nurse Midwife

## 2019-08-31 DIAGNOSIS — R69 Illness, unspecified: Secondary | ICD-10-CM | POA: Diagnosis not present

## 2019-10-03 DIAGNOSIS — Z23 Encounter for immunization: Secondary | ICD-10-CM | POA: Diagnosis not present

## 2019-11-17 NOTE — Progress Notes (Signed)
69 y.o. [id number] Divorced Caucasian female here for annual exam.    Using Vagifem once a week.  She uses olive oil for hydration.   Occasional urinary leakage with sneeze, laughing.  No leakage for no reason. She had physical therapy. Tried a pessary in the past, and it was uncomfortable.  She had a urethral sling placed with Dr. Matilde Sprang about 12 - 13 years ago.  Notes she has a dropped bladder. Voiding well.  Good bowel function.   Has 2 hot flashes per week.  Retired.   Vaccinated for Covid.   PCP: Laverna Peace, MD--[facility]  Patient's last menstrual period was 06/10/1995.           Sexually active: No.  The current method of family planning is status post hysterectomy.    Exercising: Yes.    personal trainer--weights/cardio and yoga Smoker:  no  Health Maintenance: Pap: 1997 Neg History of abnormal Pap:  no MMG: 02-18-19 3D/Neg/density B/Birads1 Colonoscopy: 2019 polyp;next 5 yrs BMD: 06-15-18  Result :Normal TDaP:  2018 Gardasil:   no HIV:~2005 NR Hep C:~2005 Neg Screening Labs:  PCP.  Pneumonia vaccines and shingles vaccines:  Completed.    reports that she has never smoked. She has never used smokeless tobacco. She reports current alcohol use of about 2.0 standard drinks of alcohol per week. She reports that she does not use drugs.  Past Medical History:  Diagnosis Date  . Anxiety   . Depression   . Fibroid   . GERD (gastroesophageal reflux disease)   . Migraines     Past Surgical History:  Procedure Laterality Date  . ABDOMINAL HYSTERECTOMY  1997   TAH-fibroids  . INCONTINENCE SURGERY     bladder sling  . 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 ALPRAZolam (XANAX) 0.25 MG tablet Take 0.25 mg by mouth at bedtime as needed for sleep.    Marland Kitchen amLODipine (NORVASC) 5 MG tablet     . atorvastatin (LIPITOR) 10 MG tablet     . Calcium  Citrate-Vitamin D (CALCIUM + D PO) Take by mouth.    . cetirizine (ZYRTEC) 10 MG tablet Take 10 mg by mouth daily.    Marland Kitchen escitalopram (LEXAPRO) 10 MG tablet Take 10 mg by mouth.    . Estradiol ([name]) 10 MCG TABS vaginal tablet Place 1 tablet (10 mcg total) vaginally 2 (two) times a week. 24 tablet 3  . lisinopril-hydrochlorothiazide (PRINZIDE,ZESTORETIC) 20-25 MG per tablet     . mometasone (NASONEX) 50 MCG/ACT nasal spray Place 2 sprays into the nose as needed.     . Multiple Minerals-Vitamins (CALCIUM CITRATE PLUS/MAGNESIUM PO) Take 1 tablet by mouth daily.    . potassium chloride SA (K-DUR,KLOR-CON) 20 MEQ tablet daily.    Marland Kitchen tretinoin (RETIN-A) 0.025 % cream   0  . vitamin B-12 (CYANOCOBALAMIN) 1000 MCG tablet Take 1,000 mcg by mouth daily.     No current facility-administered medications for this visit.    Family History  Problem Relation Age of Onset  . Hypertension Father     Review of Systems  Genitourinary:       Still with some urinary incontinence  All other systems reviewed and are negative.   Exam:   BP 138/72   Pulse 84   Temp 97.6 F (36.4 C) (Temporal)   Resp 14   Ht 5' 5.75" (1.67 m)   Wt 153 lb 3.2 oz (  69.5 kg)   LMP 06/10/1995   BMI 24.92 kg/m     General appearance: alert, cooperative and appears stated age Head: normocephalic, without obvious abnormality, atraumatic Neck: no adenopathy, supple, symmetrical, trachea midline and thyroid normal to inspection and palpation Lungs: clear to auscultation bilaterally Breasts: normal appearance, no masses or tenderness, No nipple retraction or dimpling, No nipple discharge or bleeding, No axillary adenopathy Heart: regular rate and rhythm Abdomen: soft, non-tender; no masses, no organomegaly Extremities: extremities normal, atraumatic, no cyanosis or edema Skin: skin color, texture, turgor normal. No rashes or lesions Lymph nodes: cervical, supraclavicular, and axillary nodes normal. Neurologic: grossly  normal  Pelvic: External genitalia:  no lesions              No abnormal inguinal nodes palpated.              Urethra:  normal appearing urethra with no masses, tenderness or lesions              Bartholins and Skenes: normal                 Vagina: normal appearing vagina with normal color and discharge, no lesions.  Second degree cystocele.              Cervix: absent.               Pap taken: No. Bimanual Exam:  Uterus: absent.               Adnexa: no mass, fullness, tenderness              Rectal exam: Yes.  .  Confirms.              Anus:  normal sphincter tone, no lesions  Chaperone was present for exam.  Assessment:   Well woman visit with normal exam. Status post TAH for fibroids.  Ovaries remain.  Status post bladder sling.  Status post right nephrectomy.  Cystocele. Vaginal atrophy.  Using Vagifem.   Plan: Mammogram screening discussed. Self breast awareness reviewed. Pap and HR HPV as above. Guidelines for Calcium, Vitamin D, regular exercise program including cardiovascular and weight bearing exercise. Will switch from Vagifem to vaginal estradiol cream when she runs out.  I did discuss potential effect on breast cancer.  She will let me know if she would like to try a pessary.  She may consider seeing Dr. Matilde Sprang in follow up.  Will get a copy of her op report from her bladder/urethral surgery. Follow up annually and prn.   After visit summary provided.

## 2019-11-18 ENCOUNTER — Other Ambulatory Visit: Payer: Self-pay

## 2019-11-21 ENCOUNTER — Encounter: Payer: Self-pay | Admitting: Obstetrics and Gynecology

## 2019-11-21 ENCOUNTER — Ambulatory Visit (INDEPENDENT_AMBULATORY_CARE_PROVIDER_SITE_OTHER): Payer: Medicare HMO | Admitting: Obstetrics and Gynecology

## 2019-11-21 ENCOUNTER — Other Ambulatory Visit: Payer: Self-pay

## 2019-11-21 VITALS — BP 138/72 | HR 84 | Temp 97.6°F | Resp 14 | Ht 65.75 in | Wt 153.2 lb

## 2019-11-21 DIAGNOSIS — Z01419 Encounter for gynecological examination (general) (routine) without abnormal findings: Secondary | ICD-10-CM | POA: Diagnosis not present

## 2019-11-21 NOTE — Patient Instructions (Signed)

## 2019-11-25 ENCOUNTER — Ambulatory Visit: Payer: Medicare HMO | Admitting: Certified Nurse Midwife

## 2019-11-28 DIAGNOSIS — D1801 Hemangioma of skin and subcutaneous tissue: Secondary | ICD-10-CM | POA: Diagnosis not present

## 2019-11-28 DIAGNOSIS — L814 Other melanin hyperpigmentation: Secondary | ICD-10-CM | POA: Diagnosis not present

## 2019-11-28 DIAGNOSIS — B078 Other viral warts: Secondary | ICD-10-CM | POA: Diagnosis not present

## 2019-11-28 DIAGNOSIS — Z85828 Personal history of other malignant neoplasm of skin: Secondary | ICD-10-CM | POA: Diagnosis not present

## 2019-11-28 DIAGNOSIS — L900 Lichen sclerosus et atrophicus: Secondary | ICD-10-CM | POA: Diagnosis not present

## 2019-11-28 DIAGNOSIS — L570 Actinic keratosis: Secondary | ICD-10-CM | POA: Diagnosis not present

## 2019-11-28 DIAGNOSIS — L821 Other seborrheic keratosis: Secondary | ICD-10-CM | POA: Diagnosis not present

## 2019-11-28 DIAGNOSIS — L309 Dermatitis, unspecified: Secondary | ICD-10-CM | POA: Diagnosis not present

## 2019-12-09 DIAGNOSIS — R69 Illness, unspecified: Secondary | ICD-10-CM | POA: Diagnosis not present

## 2019-12-14 DIAGNOSIS — K023 Arrested dental caries: Secondary | ICD-10-CM | POA: Diagnosis not present

## 2020-01-03 DIAGNOSIS — I10 Essential (primary) hypertension: Secondary | ICD-10-CM | POA: Diagnosis not present

## 2020-01-05 DIAGNOSIS — R350 Frequency of micturition: Secondary | ICD-10-CM | POA: Diagnosis not present

## 2020-01-05 DIAGNOSIS — R351 Nocturia: Secondary | ICD-10-CM | POA: Diagnosis not present

## 2020-01-05 DIAGNOSIS — N393 Stress incontinence (female) (male): Secondary | ICD-10-CM | POA: Diagnosis not present

## 2020-01-17 ENCOUNTER — Telehealth: Payer: Self-pay | Admitting: Obstetrics and Gynecology

## 2020-01-17 NOTE — Telephone Encounter (Signed)
Patient want to check if records have been received from Dr. Mikle Bosworth office.

## 2020-01-17 NOTE — Telephone Encounter (Signed)
Spoke with pt. Pt states wanting to know if records from Dr Mikle Bosworth office was received yet from our office. Pt advised no records in office, but could be at [facility]. Pt advised to call back and check in 1-2 weeks and then if no records are scanned, then to ask [facility] to send records again for Epic chart. Pt agreeable and verbalized understanding.  Encounter closed.

## 2020-01-24 ENCOUNTER — Other Ambulatory Visit: Payer: Self-pay | Admitting: Obstetrics and Gynecology

## 2020-01-24 DIAGNOSIS — Z1231 Encounter for screening mammogram for malignant neoplasm of breast: Secondary | ICD-10-CM

## 2020-02-08 DIAGNOSIS — H60331 Swimmer's ear, right ear: Secondary | ICD-10-CM | POA: Diagnosis not present

## 2020-02-08 DIAGNOSIS — Z23 Encounter for immunization: Secondary | ICD-10-CM | POA: Diagnosis not present

## 2020-03-01 ENCOUNTER — Ambulatory Visit
Admission: RE | Admit: 2020-03-01 | Discharge: 2020-03-01 | Disposition: A | Discharge status: Home / Self Care | Payer: Medicare HMO | Source: Ambulatory Visit

## 2020-03-01 ENCOUNTER — Other Ambulatory Visit: Payer: Self-pay

## 2020-03-01 DIAGNOSIS — Z1231 Encounter for screening mammogram for malignant neoplasm of breast: Secondary | ICD-10-CM | POA: Diagnosis not present

## 2020-03-08 DIAGNOSIS — R69 Illness, unspecified: Secondary | ICD-10-CM | POA: Diagnosis not present

## 2020-03-27 DIAGNOSIS — L820 Inflamed seborrheic keratosis: Secondary | ICD-10-CM | POA: Diagnosis not present

## 2020-03-27 DIAGNOSIS — L821 Other seborrheic keratosis: Secondary | ICD-10-CM | POA: Diagnosis not present

## 2020-03-27 DIAGNOSIS — L812 Freckles: Secondary | ICD-10-CM | POA: Diagnosis not present

## 2020-03-27 DIAGNOSIS — D1801 Hemangioma of skin and subcutaneous tissue: Secondary | ICD-10-CM | POA: Diagnosis not present

## 2020-03-27 DIAGNOSIS — L900 Lichen sclerosus et atrophicus: Secondary | ICD-10-CM | POA: Diagnosis not present

## 2020-03-27 DIAGNOSIS — Z85828 Personal history of other malignant neoplasm of skin: Secondary | ICD-10-CM | POA: Diagnosis not present

## 2020-03-30 DIAGNOSIS — H2513 Age-related nuclear cataract, bilateral: Secondary | ICD-10-CM | POA: Diagnosis not present

## 2020-03-30 DIAGNOSIS — H52203 Unspecified astigmatism, bilateral: Secondary | ICD-10-CM | POA: Diagnosis not present

## 2020-03-30 DIAGNOSIS — H04123 Dry eye syndrome of bilateral lacrimal glands: Secondary | ICD-10-CM | POA: Diagnosis not present

## 2020-04-09 ENCOUNTER — Telehealth: Payer: Self-pay

## 2020-04-09 NOTE — Telephone Encounter (Signed)
H/O Status post TAH for fibroids.  Ovaries remain.  Status post bladder sling.  Status post right nephrectomy.  Cystocele. Vaginal atrophy.  Using Vagifem.   Spoke with pt. Pt calling to get Rx for vaginal estrogen cream. Pt states has a few more tablets of Vagifem Rx left, but wanted to get Rx now to be able to switch over per Dr Elza Rafter recommendations.  Per reviewed notes on 11/2019: Will switch from Vagifem to vaginal estradiol cream when she runs out.  I did discuss potential effect on breast cancer.   Pt also states has decided not to use pessary and had follow up with Dr Matilde Sprang in July and was not given anymore recommendations. Pt wanted Dr Quincy Simmonds to know update.   Routing to Dr Quincy Simmonds, please advise on Rx vaginal estrogen cream.

## 2020-04-09 NOTE — Telephone Encounter (Signed)
Patient is calling to discuss estrogen medication replacements.

## 2020-04-10 MED ORDER — ESTRADIOL 0.1 MG/GM VA CREA: TOPICAL_CREAM | VAGINAL | 0 refills | Status: DC

## 2020-04-10 NOTE — Telephone Encounter (Signed)
Spoke with pt. Pt given update on new Rx per Dr Quincy Simmonds. Pt agreeable and verbalized understanding. Pt thankful for follow up. Rx Estrace Vag Cream 0.01% # 42.5 gm, 0RF sent to pharmacy on file.  Encounter closed

## 2020-04-10 NOTE — Telephone Encounter (Signed)
Ok to discontinue Vagifem.  Ok to start Estrace cream 0.01%, place 1/2 gram per vagina at hs 2 to 3 nights per week.  Generic OK.  Disp:  42.5 grams RF:  none.

## 2020-07-02 DIAGNOSIS — R69 Illness, unspecified: Secondary | ICD-10-CM | POA: Diagnosis not present

## 2020-07-02 DIAGNOSIS — E7800 Pure hypercholesterolemia, unspecified: Secondary | ICD-10-CM | POA: Diagnosis not present

## 2020-07-02 DIAGNOSIS — Z0000 Encounter for general adult medical examination without abnormal findings: Secondary | ICD-10-CM | POA: Diagnosis not present

## 2020-07-02 DIAGNOSIS — E559 Vitamin D deficiency, unspecified: Secondary | ICD-10-CM | POA: Diagnosis not present

## 2020-07-02 DIAGNOSIS — I10 Essential (primary) hypertension: Secondary | ICD-10-CM | POA: Diagnosis not present

## 2020-08-23 DIAGNOSIS — H6981 Other specified disorders of Eustachian tube, right ear: Secondary | ICD-10-CM | POA: Diagnosis not present

## 2020-10-07 DIAGNOSIS — R159 Full incontinence of feces: Secondary | ICD-10-CM | POA: Insufficient documentation

## 2020-10-07 DIAGNOSIS — R32 Unspecified urinary incontinence: Secondary | ICD-10-CM | POA: Insufficient documentation

## 2020-11-07 DIAGNOSIS — J0100 Acute maxillary sinusitis, unspecified: Secondary | ICD-10-CM | POA: Diagnosis not present

## 2020-11-13 DIAGNOSIS — L821 Other seborrheic keratosis: Secondary | ICD-10-CM | POA: Diagnosis not present

## 2020-11-13 DIAGNOSIS — D2372 Other benign neoplasm of skin of left lower limb, including hip: Secondary | ICD-10-CM | POA: Diagnosis not present

## 2020-11-13 DIAGNOSIS — L820 Inflamed seborrheic keratosis: Secondary | ICD-10-CM | POA: Diagnosis not present

## 2020-11-13 DIAGNOSIS — D485 Neoplasm of uncertain behavior of skin: Secondary | ICD-10-CM | POA: Diagnosis not present

## 2020-11-13 DIAGNOSIS — D1801 Hemangioma of skin and subcutaneous tissue: Secondary | ICD-10-CM | POA: Diagnosis not present

## 2020-11-13 DIAGNOSIS — L812 Freckles: Secondary | ICD-10-CM | POA: Diagnosis not present

## 2020-11-13 DIAGNOSIS — L918 Other hypertrophic disorders of the skin: Secondary | ICD-10-CM | POA: Diagnosis not present

## 2020-11-13 DIAGNOSIS — Z85828 Personal history of other malignant neoplasm of skin: Secondary | ICD-10-CM | POA: Diagnosis not present

## 2020-11-22 ENCOUNTER — Ambulatory Visit: Payer: Medicare HMO | Admitting: Obstetrics and Gynecology

## 2020-11-25 ENCOUNTER — Other Ambulatory Visit: Payer: Self-pay | Admitting: Obstetrics and Gynecology

## 2020-11-26 NOTE — Progress Notes (Signed)
70 y.o. G58P1021 Divorced Caucasian female here for annual exam.    She is followed for a second degree cystocele and vaginal atrophy.   No bladder incontinence.  She is dealing with fecal incontinence.  Not every day and not a lot.  No constipation issues.  Food choices do not make a difference that she knows of.   No new medications or change in diet.   Did pelvic floor PT in past.  She continues with exercises at home.   She is using vaginal estrogen cream once a week.   States her calcium is elevated with her PCP, but she does not understand what is next for her care of this.  She stopped taking her calcium supplement.   Just back from [place].   PCP:  Sela Hilding, MD   Patient's last menstrual period was 06/10/1995.           Sexually active: No.  The current method of family planning is status post hysterectomy.    Exercising: Yes.     Aerobics, weights and water aerobics Smoker:  no  Health Maintenance: Pap: 1997 Neg History of abnormal Pap:  no MMG: 03-01-20 3D/Neg/Birads1 Colonoscopy: 2019 polyp;next 5 yrs BMD: 06-15-18  Result :Normal TDaP:  2018 Gardasil:   no HIV:~2005 NR Hep C:~2005 Neg Screening Labs:  PCP.   reports that she has never smoked. She has never used smokeless tobacco. She reports current alcohol use of about 2.0 standard drinks of alcohol per week. She reports that she does not use drugs.  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     amLODipine (NORVASC) 5 MG tablet       atorvastatin (LIPITOR) 10 MG tablet      cetirizine (ZYRTEC) 10 MG tablet Take 10 mg by mouth daily.     Cholecalciferol (VITAMIN D3) 125 MCG (5000 UT) CAPS 1 capsule     Cyanocobalamin (VITAMIN B-12) 1000 MCG SUBL 1 tablet     escitalopram (LEXAPRO) 10 MG tablet Take 10 mg by mouth.     lisinopril-hydrochlorothiazide (PRINZIDE,ZESTORETIC) 20-25 MG per tablet      potassium chloride SA (K-DUR,KLOR-CON) 20 MEQ tablet daily.     RESTASIS 0.05 % ophthalmic emulsion 1 drop 2 (two) times daily.     traZODone (DESYREL) 50 MG tablet 1 tablet at bedtime as needed     tretinoin (RETIN-A) 0.025 % cream   0   vitamin B-12 (CYANOCOBALAMIN) 1000 MCG tablet Take 1,000 mcg by mouth daily.     estradiol (ESTRACE) 0.1 MG/GM vaginal cream PLACE 1/2 GRAM PER VAGINA AT BEDTIME 2 TO 3 NIGHTS PER WEEK. 42.5 g 1   No current facility-administered medications for this visit.    Family History  Problem Relation Age of Onset   Hypertension Father     Review of Systems  Genitourinary:        Urinary incontinence  All other systems reviewed and are negative.--has seen Dr.MacDiarmid  Exam:  BP 132/64   Pulse 84   Ht 5' 5.25" (1.657 m)   Wt 132 lb 9.6 oz (60.1 kg)   LMP 06/10/1995   SpO2 98%   BMI 21.90 kg/m     General appearance: alert, cooperative and appears stated age Head: normocephalic, without obvious abnormality, atraumatic Neck: no adenopathy, supple, symmetrical, trachea midline and thyroid normal to inspection and palpation Lungs: clear to auscultation bilaterally Breasts: normal appearance, no masses or tenderness, No nipple retraction or dimpling, No nipple discharge or bleeding, No axillary adenopathy Heart: regular rate and rhythm Abdomen: soft, non-tender; no masses, no organomegaly Extremities: extremities normal, atraumatic, no cyanosis or edema Skin: skin color, texture, turgor normal. No rashes or lesions Lymph nodes: cervical, supraclavicular, and axillary nodes  normal. Neurologic: grossly normal  Pelvic: External genitalia:  no lesions              No abnormal inguinal nodes palpated.              Urethra:  normal appearing urethra with no masses, tenderness or lesions              Bartholins and Skenes: normal                 Vagina: normal appearing vagina with normal color and discharge, no lesions.  Minimal cystocele.               Cervix: absent              Pap taken: No. Bimanual Exam:  Uterus:  absent.               Adnexa: no mass, fullness, tenderness              Rectal exam: Yes.  .  Confirms.              Anus:  normal sphincter tone, no lesions  Chaperone was present for exam.  Assessment:   Pelvic exam with abnormal finding present - small cystocele. Vaginal atrophy.  Using vaginal estrogen cream.  Status post TAH for fibroids.  Ovaries remain. Minimal cystocele. Status post bladder sling. Status post right nephrectomy. Fecal incontinence.  Encounter for rectal exam. Elevated calcium per report.   Plan: Mammogram screening discussed. Self breast awareness reviewed. Pap and HR HPV as above. Guidelines for Calcium, Vitamin D, regular exercise program including cardiovascular and weight bearing exercise. Refill of vaginal estrogen.  I discussed potential effect on breast cancer.  Will refer for pelvic floor therapy.  Start Metamucil.  If fecal incontinence not improved after 3 months, will refer to colon and rectal surgeon.  I encouraged her to contact her PCP for a follow up plan of her elevated calcium.  Follow up annually and prn.    30 min total time was spent for this patient encounter, including preparation, face-to-face counseling with the patient, coordination of care, and documentation of the encounter.

## 2020-11-26 NOTE — Telephone Encounter (Signed)
Annual exam scheduled on 11/27/20 Mammogram 02/2020

## 2020-11-27 ENCOUNTER — Other Ambulatory Visit: Payer: Self-pay

## 2020-11-27 ENCOUNTER — Ambulatory Visit (INDEPENDENT_AMBULATORY_CARE_PROVIDER_SITE_OTHER): Payer: Medicare HMO | Admitting: Obstetrics and Gynecology

## 2020-11-27 ENCOUNTER — Encounter: Payer: Self-pay | Admitting: Obstetrics and Gynecology

## 2020-11-27 ENCOUNTER — Telehealth: Payer: Self-pay | Admitting: Obstetrics and Gynecology

## 2020-11-27 VITALS — BP 132/64 | HR 84 | Ht 65.25 in | Wt 132.6 lb

## 2020-11-27 DIAGNOSIS — N952 Postmenopausal atrophic vaginitis: Secondary | ICD-10-CM

## 2020-11-27 DIAGNOSIS — Z1239 Encounter for other screening for malignant neoplasm of breast: Secondary | ICD-10-CM

## 2020-11-27 DIAGNOSIS — N8110 Cystocele, unspecified: Secondary | ICD-10-CM

## 2020-11-27 DIAGNOSIS — Z01411 Encounter for gynecological examination (general) (routine) with abnormal findings: Secondary | ICD-10-CM | POA: Diagnosis not present

## 2020-11-27 DIAGNOSIS — R159 Full incontinence of feces: Secondary | ICD-10-CM

## 2020-11-27 DIAGNOSIS — Z008 Encounter for other general examination: Secondary | ICD-10-CM

## 2020-11-27 NOTE — Patient Instructions (Signed)
EXERCISE AND DIET:  We recommended that you start or continue a regular exercise program for good health. Regular exercise means any activity that makes your heart beat faster and makes you sweat.  We recommend exercising at least 30 minutes per day at least 3 days a week, preferably 4 or 5.  We also recommend a diet low in fat and sugar.  Inactivity, poor dietary choices and obesity can cause diabetes, heart attack, stroke, and kidney damage, among others.    ALCOHOL AND SMOKING:  Women should limit their alcohol intake to no more than 7 drinks/beers/glasses of wine (combined, not each!) per week. Moderation of alcohol intake to this level decreases your risk of breast cancer and liver damage. And of course, no recreational drugs are part of a healthy lifestyle.  And absolutely no smoking or even second hand smoke. Most people know smoking can cause heart and lung diseases, but did you know it also contributes to weakening of your bones? Aging of your skin?  Yellowing of your teeth and nails?  CALCIUM AND VITAMIN D:  Adequate intake of calcium and Vitamin D are recommended.  The recommendations for exact amounts of these supplements seem to change often, but generally speaking 600 mg of calcium (either carbonate or citrate) and 800 units of Vitamin D per day seems prudent. Certain women may benefit from higher intake of Vitamin D.  If you are among these women, your doctor will have told you during your visit.    PAP SMEARS:  Pap smears, to check for cervical cancer or precancers,  have traditionally been done yearly, although recent scientific advances have shown that most women can have pap smears less often.  However, every woman still should have a physical exam from her gynecologist every year. It will include a breast check, inspection of the vulva and vagina to check for abnormal growths or skin changes, a visual exam of the cervix, and then an exam to evaluate the size and shape of the uterus and  ovaries.  And after 68 years of age, a rectal exam is indicated to check for rectal cancers. We will also provide age appropriate advice regarding health maintenance, like when you should have certain vaccines, screening for sexually transmitted diseases, bone density testing, colonoscopy, mammograms, etc.   MAMMOGRAMS:  All women over 68 years old should have a yearly mammogram. Many facilities now offer a "3D" mammogram, which may cost around $50 extra out of pocket. If possible,  we recommend you accept the option to have the 3D mammogram performed.  It both reduces the number of women who will be called back for extra views which then turn out to be normal, and it is better than the routine mammogram at detecting truly abnormal areas.    COLONOSCOPY:  Colonoscopy to screen for colon cancer is recommended for all women at age 68.  We know, you hate the idea of the prep.  We agree, BUT, having colon cancer and not knowing it is worse!!  Colon cancer so often starts as a polyp that can be seen and removed at colonscopy, which can quite literally save your life!  And if your first colonoscopy is normal and you have no family history of colon cancer, most women don't have to have it again for 10 years.  Once every ten years, you can do something that may end up saving your life, right?  We will be happy to help you get it scheduled when you are ready.    Be sure to check your insurance coverage so you understand how much it will cost.  It may be covered as a preventative service at no cost, but you should check your particular policy.    Fecal Incontinence Fecal incontinence, also called accidental bowel leakage, is not being able to control your bowels. This condition happens because the nerves or muscles around the anus do not work the way they should. This affects their ability to hold stool (feces). What are the causes? This condition may be caused by: Damage to the muscles at the end of the rectum  (sphincter). Damage to the nerves that control bowel movements. Diarrhea. Chronic constipation. Pelvic floor dysfunction. This means the muscles in the pelvis do not work well. Loss of bowel storage capacity. This occurs when the rectum can no longer stretch in size in order to store feces. Inflammatory bowel disease (IBD), such as Crohn's disease. Irritable bowel syndrome (IBS). What increases the risk? You are more likely to develop this condition if you: Were born with bowels or a pelvis that did not form correctly. Have had rectal surgery. Have had radiation treatment for certain cancers. Have been pregnant, had a vaginal delivery, or had surgery that damaged the pelvic floor muscles. Had a complicated childbirth, spinal cord injury, or other trauma that caused nerve damage. Have a condition that can affect nerve function, such as diabetes, Parkinson's disease, or multiple sclerosis. Have a condition where the rectum drops down into the anus or vagina (prolapse). Are 68 years of age or older. What are the signs or symptoms? The main symptom of this condition is not being able to control your bowels.You also might not be able to get to the bathroom before a bowel movement. How is this diagnosed? This condition is diagnosed with a medical history and physical exam. You may also have other tests, including: Blood tests. Urine tests. A rectal exam. Ultrasound. MRI. Colonoscopy. This is an exam that looks at your large intestine (colon). Anal manometry. This is a test that measures the strength of the anal sphincter. Anal electromyogram (EMG). This is a test that uses small electrodes to check for nerve damage. How is this treated? Treatment for this condition depends on the cause and severity. Treatment may also focus on addressing any underlying causes of this condition. Treatment may include: Medicines. This may include medicines to: Prevent diarrhea. Help with constipation  (bulk-forming laxatives). Treat any underlying conditions. Biofeedback therapy. This can help to retrain muscles that are affected. Fiber supplements. These can help manage your bowel movements. Nerve stimulation. Injectable gel to promote tissue growth and better muscle control. Surgery. You may need: Sphincter repair surgery. Diversion surgery. This procedure lets feces pass out of your body through a hole in your abdomen. Follow these instructions at home: Eating and drinking  Follow instructions from your health care provider about any eating or drinking restrictions. Work with a dietitian to come up with a healthy diet that will help you avoid the foods that can make your condition worse. Keep a diet diary to find out which foods or drinks could be making your condition worse. Drink enough fluid to keep your urine pale yellow.  Lifestyle Do not use any products that contain nicotine or tobacco, such as cigarettes and e-cigarettes. If you need help quitting, ask your health care provider. This may help your condition. If you are overweight, talk with your health care provider about how to safely lose weight. This may help your condition. Increase  your physical activity as told by your health care provider. This may help your condition. Always talk with your health care provider before starting a new exercise program. Carry a change of clothes and supplies to clean up quickly if you have an episode of fetal incontinence. Consider joining a fecal incontinence support group. You can find a support group online or in your local community. General instructions  Take over-the-counter and prescription medicines only as told by your health care provider. This includes any supplements. Apply a moisture barrier, such as petroleum jelly, to your rectum. This protects the skin from irritation caused by ongoing leaking or diarrhea. Tell your health care provider if you are upset or depressed about  your condition. Keep all follow-up visits as told by your health care provider. This is important.  Where to find more information [employer] Functional Gastrointestinal Disorders: iffgd.[place] [facility]: patients.gi.org Contact a health care provider if: You have a fever. You have redness, swelling, or pain around your rectum. Your pain is getting worse or you lose feeling in your rectal area. You have blood in your stool. You feel sad or hopeless. You avoid social or work situations. Get help right away if: You stop having bowel movements. You cannot eat or drink without vomiting. You have rectal bleeding that does not stop. You have severe pain that is getting worse. You have symptoms of dehydration, including: Sleepiness or fatigue. Producing little or no urine, tears, or sweat. Dizziness. Dry mouth. Unusual irritability. Headache. Inability to think clearly. Summary Fecal incontinence, also called accidental bowel leakage, is not being able to control your bowels. This condition happens because the nerves or muscles around the anus do not work the way they should. Treatment varies depending on the cause and severity of your condition. Treatment may also focus on addressing any underlying causes of this condition. Follow instructions from your health care provider about any eating or drinking restrictions, lifestyle changes, and skin care. Take over-the-counter and prescription medicines only as told by your health care provider. This includes any supplements. Tell your health care provider if your symptoms worsen or if you are upset or depressed about your condition. This information is not intended to replace advice given to you by your health care provider. Make sure you discuss any questions you have with your healthcare provider. Document Revised: 10/08/2017 Document Reviewed: 10/08/2017 Elsevier Patient Education  2022 [employer].

## 2020-11-27 NOTE — Telephone Encounter (Signed)
Please make referral to [facility] for pelvic floor therapy for new onset fecal incontinence.

## 2020-11-28 NOTE — Telephone Encounter (Signed)
Referral placed at [facility] location they will call to schedule. Patient aware, number given to call and schedule as well if needed.

## 2020-12-05 NOTE — Telephone Encounter (Signed)
Patient scheduled on 01/30/21

## 2020-12-31 DIAGNOSIS — I10 Essential (primary) hypertension: Secondary | ICD-10-CM | POA: Diagnosis not present

## 2021-01-18 ENCOUNTER — Other Ambulatory Visit: Payer: Self-pay | Admitting: Family Medicine

## 2021-01-18 DIAGNOSIS — Z1231 Encounter for screening mammogram for malignant neoplasm of breast: Secondary | ICD-10-CM

## 2021-01-25 ENCOUNTER — Encounter: Payer: Self-pay | Admitting: Physical Therapy

## 2021-01-25 ENCOUNTER — Ambulatory Visit: Payer: Medicare HMO | Attending: Obstetrics and Gynecology | Admitting: Physical Therapy

## 2021-01-25 ENCOUNTER — Other Ambulatory Visit: Payer: Self-pay

## 2021-01-25 DIAGNOSIS — M6281 Muscle weakness (generalized): Secondary | ICD-10-CM | POA: Insufficient documentation

## 2021-01-25 DIAGNOSIS — R278 Other lack of coordination: Secondary | ICD-10-CM | POA: Insufficient documentation

## 2021-01-25 NOTE — Patient Instructions (Addendum)
About Pelvic Support Problems Pelvic Support Problems Explained Ligaments, muscles, and connective tissue normally hold your bladder, uterus, and other organs in their proper places in your pelvis. When these tissues become weak, a problem with pelvic support may result. Weak support can cause one or more of the pelvic organs to drop down into the vagina. An organ may even drop so far that is partially exposed outside the body.  Pelvic support problems are named by the change in the organ. The main types of pelvic support problems are:  Cystocele: When the bladder drops down into your vagina.  Enterocele: When your small intestine drops between your vagina and rectum.  Rectocele: When your rectum bulges into the vaginal wall.  Uterine prolapse: When your uterus drops into your vagina.  Vaginal prolapse: When the top part of the vagina begins to droop. This sometimes happens after a hysterectomy (removal of the uterus).  Causes Pelvic support problems can be caused by many conditions. They may begin after you give birth, especially if you had a large baby. During childbirth, the muscles and skin of the birth canal (vagina) are stretched and sometimes torn. They heal over time but are not always exactly the same. A long pushing stage of labor may also weaken these tissues as well as very rapid births as the tissues do not have time to stretch so they tear.  Also, after menopause, there are changes in the vaginal walls resulting from a decrease in estrogen. Estrogen helps to keep the tissues toned. Low levels of estrogen weaken the vaginal walls and may cause the bladder to shift from its normal position. As women get older, the loss of muscle tone and the relaxation of muscles may cause the uterus or other organs to drop.  Over time, conditions like chronic coughing, chronic constipation, doing a lot of heavy lifting, straining to pass stool, and obesity, can also weaken the pelvic support muscles.   Diagnosing Pelvic Support Problems Your health care provider will ask about your symptoms and do a pelvic examination. Your provider may also do a rectal exam during your pelvic exam. Your provider may ask you to: 1. Bear down and push (like you are having a bowel movement) so he or she can see if your bladder or other part of your body protrudes into the vagina. 2. Contract the muscles of your pelvis to check the strength of your pelvic muscles.  3. Do several types of urine, nerve and muscle tests of the pelvis and around the bladder to see what type of treatment is best for you.   Symptoms Symptoms of pelvic support problems depend on the organ involved, but may include:  urine leakage  stain or fecal loss after a bowel movement trouble having bowel movements  ache in the lower abdomen, groin, or lower back  bladder infection  a feeling of heaviness, pulling, or fullness in the pelvis, or a feeling that something is falling out of the vagina  an organ protruding from your vaginal opening  feeling the need to support the organs or perineal area to empty bladder or bowels painful sexual intercourse.  Many women feel pelvic pressure or trouble holding their urine immediately after childbirth. For some, these symptoms go away permanently, in others they return as they get older.  Treatment Options A prolapsed organ cannot repair itself. Contact your health care provider as soon as you notice symptoms of a problem. Treatment depends on what the specific problem is and how far  advanced it is.  The symptoms caused by some pelvic support problems may simply be treated with changes in diet, medicine to soften the stool, weight loss, or avoiding strenuous activities. You may also do pelvic floor exercises to help strengthen your pelvic muscles.  Some cases of prolapse may require a special support device made from plastic or rubber called a pessary that fits into the vagina to support the uterus,  vagina, or bladder. A pessary can also help women who leak urine when coughing, straining, or exercising. In mild cases, a tampon or vaginal diaphragm may be used instead of a pessary.  Talk to your doctor or health care provider about these options. In serious cases, surgery may be needed to put the organs back into their proper place. The uterus may be removed because of the pressure it puts on the bladder.  Your doctor will know what surgery will be best for you. How can I prevent pelvic support problems?  You can help prevent pelvic support problems by:  maintaining a healthy lifestyle  continuing to do pelvic floor exercises after you deliver a baby  maintaining a healthy weight  avoiding a lot of heavy lifting and lifting with your legs (not from your waist)  treating constipation and avoid getting   About Cystocele Overview The pelvic organs, including the bladder, are normally supported by pelvic floor muscles and ligaments.   When these muscles and ligaments are stretched, weakened or torn, the wall between the bladder and the vagina sags or herniates causing a prolapse, sometimes called a cystocele. This condition may cause discomfort and problems with emptying the bladder.  It can be present in various stages.  Some people are not aware of the changes. Others may notice changes at the vaginal opening or a feeling of the bladder dropping outside the body. Causes of a Cystocele A cystocele is usually caused by muscle straining or stretching during childbirth.  In addition, cystocele is more common after menopause, because the hormone estrogen helps keep the elastic tissues around the pelvic organs strong.  A cystocele is more likely to occur when levels of estrogen decrease. Other causes include: heavy lifting, chronic coughing, previous pelvic surgery and obesity.  Symptoms A bladder that has dropped from its normal position may cause: unwanted urine leakage (stress incontinence),  frequent urination or urge to urinate, incomplete emptying of the bladder (not feeling bladder relief after emptying), pain or discomfort in the vagina, pelvis, groin, lower back or lower abdomen and frequent urinary tract infections.  Mild cases may not cause any symptoms.  Treatment Options Pelvic floor (Kegel) exercises: Strength training the muscles in your genital area  Behavioral changes: Treating and preventing constipation, taking time to empty your bladder properly, learning to lift properly and/or  avoid heavy lifting when possible, stopping smoking, avoiding weight gain and treating a chronic cough or bronchitis. A pessary: A vaginal support device is sometimes used to help pelvic support caused by muscle and ligament changes. Surgery: Aurgical repair may be necessary if symptoms cannot be managed with exercise, behavioral changes and a pessary. Surgery is usually considered for severe cases.    Lay down with pillow under hips and legs on couch for 10 minutes when you feel the prolapse is worse and during the middle of the day.  [facility] [address] [place], [place] [address] Phone # [phone] Fax [phone]

## 2021-01-25 NOTE — Therapy (Signed)
[facility] [address] [place], [place], [address] Phone: [phone]   Fax:  [phone]  Physical Therapy Evaluation  Patient Details  Name: Dominique Scott MRN: [id number] Date of Birth: 21-Sep-1950 Referring Provider (PT): Dr. Hosie Poisson  Encounter Date: 01/25/2021   PT End of Session - 01/25/21 1139     Visit Number 1    Date for PT Re-Evaluation 05/27/21    Authorization Type [employer]na medicare    PT Start Time 1100    PT Stop Time 1135    PT Time Calculation (min) 35 min    Activity Tolerance Patient tolerated treatment well    Behavior During Therapy WFL for tasks assessed/performed             Past Medical History:  Diagnosis Date   Anxiety    Depression    Fibroid    GERD (gastroesophageal reflux disease)    Migraines     Past Surgical History:  Procedure Laterality Date   ABDOMINAL HYSTERECTOMY  1997   TAH-fibroids   INCONTINENCE SURGERY     bladder sling   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    Subjective Assessment - 01/25/21 1104     Subjective Patient had bladder issues since her having her son. Patient had a bladder sling and not the bladder is down again. Fecal incontinence started 6 months ago. The stool is on the underwear and was short lived. Has not happened much lately. Bowel movement daily. No straining to have a bowel movement.    Patient Stated Goals exercise program for the pelvic floor    Currently in Pain? No/denies                [facility] PT Assessment - 01/25/21 0001       Assessment   Medical Diagnosis R15.9 incontinence of feces, unspecified fecal incontinence type    Referring Provider (PT) Dr. Hosie Poisson    Onset Date/Surgical Date --   07/2020   Prior Therapy none      Precautions   Precautions Other (comment)    Precaution Comments kidney cancer with  abdominal surgery      Restrictions   Weight Bearing Restrictions No      Balance Screen   Has the patient fallen in the past 6 months No    Has the patient had a decrease in activity level because of a fear of falling?  No    Is the patient reluctant to leave their home because of a fear of falling?  No      Home Ecologist residence      Prior Function   Level of Independence Independent    Vocation Retired    Pharmacist, hospital; worked in [employer]    Leisure workout daily- walk, yoga, strength work      Cognition   Overall Cognitive Status Within Functional Limits for tasks assessed      Observation/Other Assessments   Skin Integrity abdominal scar has restrictions      Posture/Postural Control   Posture/Postural Control No significant limitations    Posture Comments patient has scoliosis      ROM / Strength   AROM / PROM / Strength AROM;PROM;Strength      AROM   Lumbar Extension decreased by 50%    Lumbar - Right Side  Bend decreased by 25%    Lumbar - Left Side Bend decreased by 25%    Lumbar - Right Rotation decreased by 25%    Lumbar - Left Rotation decreased by 25%      Strength   Overall Strength Comments when contract the abdominals will bulge the lower abdominals    Right Hip Flexion 4/5    Right Hip Extension 4/5    Right Hip External Rotation  4/5    Right Hip Internal Rotation 4/5    Right Hip ABduction 4+/5    Left Hip Flexion 4/5    Left Hip External Rotation 4/5    Left Hip Internal Rotation 4/5    Left Hip ABduction 4+/5             No emotional/communication barriers or cognitive limitation. Patient is motivated to learn. Patient understands and agrees with treatment goals and plan. PT explains patient will be examined in standing, sitting, and lying down to see how their muscles and joints work. When they are ready, they will be asked to remove their underwear so PT can examine their  perineum. The patient is also given the option of providing their own chaperone as one is not provided in our facility. The patient also has the right and is explained the right to defer or refuse any part of the evaluation or treatment including the internal exam. With the patient's consent, PT will use one gloved finger to gently assess the muscles of the pelvic floor, seeing how well it contracts and relaxes and if there is muscle symmetry. After, the patient will get dressed and PT and patient will discuss exam findings and plan of care. PT and patient discuss plan of care, schedule, attendance policy and HEP activities.         Objective measurements completed on examination: See above findings.     Pelvic Floor Special Questions - 01/25/21 0001     Prior Pregnancies Yes    Number of Vaginal Deliveries 1   10#   Any difficulty with labor and deliveries --   had a few stitches   Currently Sexually Active No    Urinary Leakage Yes    Activities that cause leaking Laughing   when sitting down   Urinary urgency No    Fecal incontinence Yes   during exercise, jumping   Fluid intake mostly water    Falling out feeling (prolapse) Yes    Activities that cause feeling of prolapse feels it toward the end of the day; exercise    Prolapse Anterior Wall   to the introitus   Pelvic Floor Internal Exam Patient confirms identification and approves PT to assess pelvic floor and treatemtn    Exam Type Vaginal    Strength weak squeeze, no lift              [facility] Adult PT Treatment/Exercise - 01/25/21 0001       Self-Care   Self-Care Other Self-Care Comments    Other Self-Care Comments  education on what a prolapse is, some management techniques, breathing during lifting; laying down with hips on the pillow and feet on couch to reduce the prolapse                    PT Education - 01/25/21 1138     Education Details education on what a prolapse is, some ways to manage, laying  down with legs on couch and hips on pillow to reduce the  prolapse    Person(s) Educated Patient    Methods Explanation;Demonstration;Handout    Comprehension Verbalized understanding;Returned demonstration              PT Short Term Goals - 01/25/21 1147       PT SHORT TERM GOAL #1   Title independent with basic pelvic floor and abdomial program    Time 4    Period Weeks    Status New    Target Date 02/22/21      PT SHORT TERM GOAL #2   Title understand ways to manage cystocele with pressure management, positions to reduce the prolapse    Time 4    Period Weeks    Status New    Target Date 02/22/21      PT SHORT TERM GOAL #3   Title able to contract the abdominals without bulging the pelvic floor    Time 4    Period Weeks    Status New    Target Date 02/22/21               PT Long Term Goals - 01/25/21 1148       PT LONG TERM GOAL #1   Title Independent with advanced HEP for core and pelvic floor strength    Time 4    Period Months    Status New    Target Date 05/27/21      PT LONG TERM GOAL #2   Title pelvic floor strength >/= 3/5 in supine and standing to support the cystocele    Time 4    Period Months    Status New    Target Date 05/27/21      PT LONG TERM GOAL #3   Title able to demonstrate the correct lifting technique to not put pressure on the pelvic floor and breathing correctly    Time 4    Period Months    Status New    Target Date 05/27/21      PT LONG TERM GOAL #4   Title exercising with weights without lower abdominal bulge due to improved pelvic floor and abdominal contraction    Time 4    Period Months    Status New    Target Date 05/27/21      PT LONG TERM GOAL #5   Title reduction of urinaty leakage with laughing and no fecal incontince due to improved pelvic floor strength    Time 4    Period Months    Status New    Target Date 05/27/21                    Plan - 01/25/21 1140     Clinical Impression  Statement Patient is a 70 year old female with fecal leakage and grade 2 cystocele for the past 6 months. Patient reports she is not having the fecal leakage anymore but still concerned with the cystocele. Patient had a bladder sling procerdure in the past. Patient has an abdominal scar that goes along the midline of the abdomen and is restrictive. When patient contracts her abdomen she will bulge the lower abdomen. Lumbar ROM is restrictive. She has weakness in bilateral hips. Pelvic floor strength is 2/5. She has anterior wall weakness with the bladder to the introitus. Patient feels the prolapse more toward the end of the day and with exercise. Patient has urinary leakage with laughing in a seated position. She is not straining to have a bowel movement and will  have one daily. Patient will benefit from skilled therapy to improve pelvic floor coordinaiton and strength with management of prolapse.    Personal Factors and Comorbidities Comorbidity 3+;Fitness    Comorbidities kidney cancer; bladder sling; Abdominal hysterectomy 1997; Fibroid    Examination-Activity Limitations Lift;Continence    Examination-Participation Restrictions Community Activity;Laundry    Stability/Clinical Decision Making Stable/Uncomplicated    Clinical Decision Making Low    Rehab Potential Excellent    PT Frequency 1x / week    PT Duration Other (comment)   4 months   PT Treatment/Interventions Biofeedback;Therapeutic activities;Therapeutic exercise;Neuromuscular re-education;Patient/family education;Manual techniques;Scar mobilization;Spinal Manipulations    PT Next Visit Plan pelvic floor strength in supine; abdominal contraction without bulging the lower abdomen; lifting with correct breath; hip hinge    Consulted and Agree with Plan of Care Patient             Patient will benefit from skilled therapeutic intervention in order to improve the following deficits and impairments:  Decreased coordination, Decreased  range of motion, Increased fascial restricitons, Decreased activity tolerance, Decreased strength, Decreased mobility, Decreased scar mobility  Visit Diagnosis: Muscle weakness (generalized)  Other lack of coordination     Problem List Patient Active Problem List   Diagnosis Date Noted   Back pain 01/26/2017   Hypertension 10/30/2016    Class: Chronic   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 [place], [place], [address] Phone: [phone]   Fax:  [phone]  Name: Dominique Scott MRN: [id number] Date of Birth: 1950-10-18

## 2021-01-30 ENCOUNTER — Ambulatory Visit: Payer: Medicare HMO | Admitting: Physical Therapy

## 2021-01-30 ENCOUNTER — Encounter: Payer: Self-pay | Admitting: Physical Therapy

## 2021-01-30 ENCOUNTER — Other Ambulatory Visit: Payer: Self-pay

## 2021-01-30 DIAGNOSIS — R278 Other lack of coordination: Secondary | ICD-10-CM

## 2021-01-30 DIAGNOSIS — M6281 Muscle weakness (generalized): Secondary | ICD-10-CM | POA: Diagnosis not present

## 2021-01-30 NOTE — Therapy (Signed)
[facility] [address] [place], [place], [address] Phone: [phone]   Fax:  [phone]  Physical Therapy Treatment  Patient Details  Name: Dominique Scott MRN: [id number] Date of Birth: 1950/12/23 Referring Provider (PT): Dr. Hosie Poisson   Encounter Date: 01/30/2021   PT End of Session - 01/30/21 1525     Visit Number 2    Date for PT Re-Evaluation 05/27/21    Authorization Type A[employer]na medicare    Authorization - Visit Number 2    Authorization - Number of Visits 10    PT Start Time [id number]    PT Stop Time 1523    PT Time Calculation (min) 38 min    Activity Tolerance Patient tolerated treatment well    Behavior During Therapy WFL for tasks assessed/performed             Past Medical History:  Diagnosis Date   Anxiety    Depression    Fibroid    GERD (gastroesophageal reflux disease)    Migraines     Past Surgical History:  Procedure Laterality Date   ABDOMINAL HYSTERECTOMY  1997   TAH-fibroids   INCONTINENCE SURGERY     bladder sling   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   Subjective Assessment - 01/30/21 1451     Subjective No fecal incontinence.    Patient Stated Goals exercise program for the pelvic floor    Currently in Pain? No/denies    Multiple Pain Sites No                               [facility] Adult PT Treatment/Exercise - 01/30/21 0001       Self-Care   Self-Care Other Self-Care Comments    Other Self-Care Comments  educated patient on how to lay during the day instead of the night time to give the prolapse a break      Lumbar Exercises: Supine   Ab Set 10 reps;5 seconds   ball squeeze   AB Set Limitations cue to contract the lower abdominal, pelvic floor and count out loud    Bridge with [employer] 10 reps   instructions on breath   Bridge with [employer]  Limitations with pelvic floor contraction    Isometric Hip Flexion 5 reps;5 seconds   5 each leg     Lumbar Exercises: Sidelying   Other Sidelying Lumbar Exercises sidely with pillow bwtween knees, squeeze the pillow as she presses a ball into the mat ad hold 5 sec with a pelvic floor contraction and abdominal contraction      Lumbar Exercises: Quadruped   Other Quadruped Lumbar Exercises pelvic floor contraction holding 5 second with a ball squeeze and abdominal bracing 10x      Manual Therapy   Manual Therapy Soft tissue mobilization;Myofascial release    Manual therapy comments educated patient scar massage on the abdomen    Soft tissue mobilization scar massage on the abdomen to improve tissue mobility    Myofascial Release using the suction cup to pull on the scar and around the scar to release fascial                    PT Education - 01/30/21 1519     Education Details Access Code: [email or web address]; educated patient on scar massage  Person(s) Educated Patient    Methods Explanation;Demonstration;Verbal cues    Comprehension Returned demonstration;Verbalized understanding              PT Short Term Goals - 01/25/21 1147       PT SHORT TERM GOAL #1   Title independent with basic pelvic floor and abdomial program    Time 4    Period Weeks    Status New    Target Date 02/22/21      PT SHORT TERM GOAL #2   Title understand ways to manage cystocele with pressure management, positions to reduce the prolapse    Time 4    Period Weeks    Status New    Target Date 02/22/21      PT SHORT TERM GOAL #3   Title able to contract the abdominals without bulging the pelvic floor    Time 4    Period Weeks    Status New    Target Date 02/22/21               PT Long Term Goals - 01/25/21 1148       PT LONG TERM GOAL #1   Title Independent with advanced HEP for core and pelvic floor strength    Time 4    Period Months    Status New    Target Date 05/27/21       PT LONG TERM GOAL #2   Title pelvic floor strength >/= 3/5 in supine and standing to support the cystocele    Time 4    Period Months    Status New    Target Date 05/27/21      PT LONG TERM GOAL #3   Title able to demonstrate the correct lifting technique to not put pressure on the pelvic floor and breathing correctly    Time 4    Period Months    Status New    Target Date 05/27/21      PT LONG TERM GOAL #4   Title exercising with weights without lower abdominal bulge due to improved pelvic floor and abdominal contraction    Time 4    Period Months    Status New    Target Date 05/27/21      PT LONG TERM GOAL #5   Title reduction of urinaty leakage with laughing and no fecal incontince due to improved pelvic floor strength    Time 4    Period Months    Status New    Target Date 05/27/21                   Plan - 01/30/21 1452     Clinical Impression Statement When patient laughs she will bulge the lower part of the abdomen. Patient needs verbal cues to count with exercise so she is not holding her breath. Patient had increased mobility of the abdominal scar after the manual work and she was educated on how to perform scar work at home. Patient understands to lay with hips on a pillow and legs on a couch during the day instead of just before going to bed. Patient will benefit from skilled therapy to improve pelvic floor coordination and strength with management.    Personal Factors and Comorbidities Comorbidity 3+;Fitness    Comorbidities kidney cancer; bladder sling; Abdominal hysterectomy 1997; Fibroid    Examination-Activity Limitations Lift;Continence    Stability/Clinical Decision Making Stable/Uncomplicated    Rehab Potential Excellent    PT Frequency 1x /  week    PT Duration Other (comment)   4 months   PT Treatment/Interventions Biofeedback;Therapeutic activities;Therapeutic exercise;Neuromuscular re-education;Patient/family education;Manual techniques;Scar  mobilization;Spinal Manipulations    PT Next Visit Plan abdominal contraction with arm and leg movement; scar massage; lifting with correct breath; hip hinge    PT Home Exercise Plan Access Code: [email or web address]    Recommended Other Services MD signed initial note    Consulted and Agree with Plan of Care Patient             Patient will benefit from skilled therapeutic intervention in order to improve the following deficits and impairments:  Decreased coordination, Decreased range of motion, Increased fascial restricitons, Decreased activity tolerance, Decreased strength, Decreased mobility, Decreased scar mobility  Visit Diagnosis: Muscle weakness (generalized)  Other lack of coordination     Problem List Patient Active Problem List   Diagnosis Date Noted   Back pain 01/26/2017   Hypertension 10/30/2016    Class: Chronic   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, [place], [place], [address] Phone: [phone]   Fax:  [phone]  Name: Dominique Scott MRN: [id number] Date of Birth: [birth year]

## 2021-01-30 NOTE — Patient Instructions (Signed)
Access Code: [email or web address] URL: [email or web address] Date: 01/30/2021 Prepared by: Earlie Counts  Exercises Hooklying Transversus Abdominis Palpation - 1 x daily - 7 x weekly - 1 sets - 10 reps - 5 sec hold Supine Bridge with Mini Swiss Ball Between Knees - 1 x daily - 7 x weekly - 1 sets - 10 reps Hooklying Isometric Hip Flexion - 1 x daily - 7 x weekly - 1 sets - 10 reps - 5 sec hold Sidelying Hip Adduction Isometric with Ball - 1 x daily - 7 x weekly - 1 sets - 10 reps - 5 sec hold Quadruped Pelvic Floor Contraction with Weight Shift Side to Side - 1 x daily - 7 x weekly - 1 sets - 10 reps - 5 sec hold [facility] [address] [place], [place] [address] Phone # [phone] Fax [phone]

## 2021-02-22 DIAGNOSIS — Z23 Encounter for immunization: Secondary | ICD-10-CM | POA: Diagnosis not present

## 2021-03-14 ENCOUNTER — Ambulatory Visit
Admission: RE | Admit: 2021-03-14 | Discharge: 2021-03-14 | Disposition: A | Discharge status: Home / Self Care | Payer: Medicare HMO | Source: Ambulatory Visit | Attending: Family Medicine | Admitting: Family Medicine

## 2021-03-14 ENCOUNTER — Other Ambulatory Visit: Payer: Self-pay

## 2021-03-14 DIAGNOSIS — Z1231 Encounter for screening mammogram for malignant neoplasm of breast: Secondary | ICD-10-CM

## 2021-04-01 ENCOUNTER — Other Ambulatory Visit: Payer: Self-pay

## 2021-04-01 ENCOUNTER — Ambulatory Visit: Payer: Medicare HMO | Attending: Obstetrics and Gynecology | Admitting: Physical Therapy

## 2021-04-01 ENCOUNTER — Encounter: Payer: Self-pay | Admitting: Physical Therapy

## 2021-04-01 DIAGNOSIS — M6281 Muscle weakness (generalized): Secondary | ICD-10-CM | POA: Diagnosis not present

## 2021-04-01 DIAGNOSIS — R278 Other lack of coordination: Secondary | ICD-10-CM | POA: Insufficient documentation

## 2021-04-01 NOTE — Therapy (Signed)
[facility] [place], [place], [address] Phone: [phone]   Fax:  [phone]  Physical Therapy Treatment  Patient Details  Name: Dominique Scott MRN: [id number] Date of Birth: 14-Oct-1950 Referring Provider (PT): Dr. Hosie Poisson   Encounter Date: 04/01/2021   PT End of Session - 04/01/21 1312     Visit Number 3    Date for PT Re-Evaluation 05/27/21    Authorization Type Aetna medicare    Authorization - Visit Number 3    Authorization - Number of Visits 10    PT Start Time 2774    PT Stop Time 1310    PT Time Calculation (min) 40 min    Activity Tolerance Patient tolerated treatment well    Behavior During Therapy WFL for tasks assessed/performed             Past Medical History:  Diagnosis Date   Anxiety    Depression    Fibroid    GERD (gastroesophageal reflux disease)    Migraines     Past Surgical History:  Procedure Laterality Date   ABDOMINAL HYSTERECTOMY  1997   TAH-fibroids   INCONTINENCE SURGERY     bladder sling   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   Subjective Assessment - 04/01/21 1240     Subjective I have been faithful for the exercises. No fecal leakage.    Patient Stated Goals exercise program for the pelvic floor    Currently in Pain? No/denies    Multiple Pain Sites No                               [facility] Adult PT Treatment/Exercise - 04/01/21 0001       Lumbar Exercises: Standing   Other Standing Lumbar Exercises sit to stand with pelvic floor contraction and lower abodminal contraction to take the pressure off the prolapse 15x      Lumbar Exercises: Supine   Ab Set 10 reps;5 seconds   ball squeeze   AB Set Limitations cue to contract the lower abdominal, pelvic floor using hans to facilitate the lift; then do in sitting and standing with adding  the pelvic floor ocontraction      Manual Therapy   Manual Therapy Soft tissue mobilization;Taping    Soft tissue mobilization scar massage on the abdomen to improve tissue mobility    Kinesiotex Facilitate Muscle      Kinesiotix   Facilitate Muscle  to engage the lower abdoninals                     PT Education - 04/01/21 1311     Education Details Access Code: [email or web address]    Person(s) Educated Patient    Methods Explanation;Demonstration;Handout    Comprehension Returned demonstration;Verbalized understanding              PT Short Term Goals - 04/01/21 1241       PT SHORT TERM GOAL #1   Title independent with basic pelvic floor and abdomial program    Time 4    Period Weeks    Status Achieved      PT SHORT TERM GOAL #2   Title understand ways to manage cystocele with pressure management, positions to reduce the prolapse    Time 4  Period Weeks    Status On-going      PT SHORT TERM GOAL #3   Title able to contract the abdominals without bulging the pelvic floor    Time 4    Period Weeks    Status On-going               PT Long Term Goals - 04/01/21 1242       PT LONG TERM GOAL #5   Title reduction of urinaty leakage with laughing and no fecal incontince due to improved pelvic floor strength    Baseline no fecal leakage or urinary leakage.    Time 4    Period Months    Status Achieved                   Plan - 04/01/21 1312     Clinical Impression Statement Patient is not havine urinary or fecal leakage. She is now able to engage her lower abdominals better and the tape is assiting the muscles to kick in. Patient is able to contract her pelvic floor with abdominal contraction. She is able to go from sit to stand with greater ease when she contract her abdominals. Patient has increased mobility of her abdominal scar. Patient will benefit from skilled therapy ot improve pelvic floor coordination and strength with with managment.     Personal Factors and Comorbidities Comorbidity 3+;Fitness    Comorbidities kidney cancer; bladder sling; Abdominal hysterectomy 1997; Fibroid    Examination-Activity Limitations Lift;Continence    Examination-Participation Restrictions Community Activity;Laundry    Stability/Clinical Decision Making Stable/Uncomplicated    Rehab Potential Excellent    PT Frequency 1x / week    PT Duration Other (comment)   4 months   PT Treatment/Interventions Biofeedback;Therapeutic activities;Therapeutic exercise;Neuromuscular re-education;Patient/family education;Manual techniques;Scar mobilization;Spinal Manipulations    PT Next Visit Plan lifting with pelvic floor contraction; advanced abdominal exercises in supine and quadruped with pelvic floor contraction    PT Home Exercise Plan Access Code: [email or web address]    Consulted and Agree with Plan of Care Patient             Patient will benefit from skilled therapeutic intervention in order to improve the following deficits and impairments:  Decreased coordination, Decreased range of motion, Increased fascial restricitons, Decreased activity tolerance, Decreased strength, Decreased mobility, Decreased scar mobility  Visit Diagnosis: Muscle weakness (generalized)  Other lack of coordination     Problem List Patient Active Problem List   Diagnosis Date Noted   Back pain 01/26/2017   Hypertension 10/30/2016    Class: Chronic   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

## 2021-04-01 NOTE — Patient Instructions (Signed)
Access Code: [email or web address] URL: [email or web address] Date: 04/01/2021 Prepared by: Earlie Counts  Exercises Hooklying Transversus Abdominis Palpation - 1 x daily - 7 x weekly - 1 sets - 10 reps - 5 sec hold Supine Bridge with Mini Swiss Ball Between Knees - 1 x daily - 7 x weekly - 1 sets - 10 reps Hooklying Isometric Hip Flexion - 1 x daily - 7 x weekly - 1 sets - 10 reps - 5 sec hold Sidelying Hip Adduction Isometric with Ball - 1 x daily - 7 x weekly - 1 sets - 10 reps - 5 sec hold Quadruped Pelvic Floor Contraction with Weight Shift Side to Side - 1 x daily - 7 x weekly - 1 sets - 10 reps - 5 sec hold Sit to Stand with Pelvic Floor Contraction - 1 x daily - 7 x weekly - 1 sets - 10 reps [facility] [address] [place], [place] [address] Phone # [phone] Fax [phone]

## 2021-04-02 DIAGNOSIS — H5213 Myopia, bilateral: Secondary | ICD-10-CM | POA: Diagnosis not present

## 2021-04-02 DIAGNOSIS — H04123 Dry eye syndrome of bilateral lacrimal glands: Secondary | ICD-10-CM | POA: Diagnosis not present

## 2021-04-02 DIAGNOSIS — H2513 Age-related nuclear cataract, bilateral: Secondary | ICD-10-CM | POA: Diagnosis not present

## 2021-04-15 ENCOUNTER — Ambulatory Visit: Payer: Medicare HMO | Attending: Obstetrics and Gynecology | Admitting: Physical Therapy

## 2021-04-15 ENCOUNTER — Other Ambulatory Visit: Payer: Self-pay

## 2021-04-15 ENCOUNTER — Encounter: Payer: Self-pay | Admitting: Physical Therapy

## 2021-04-15 DIAGNOSIS — M6281 Muscle weakness (generalized): Secondary | ICD-10-CM | POA: Insufficient documentation

## 2021-04-15 DIAGNOSIS — R278 Other lack of coordination: Secondary | ICD-10-CM | POA: Diagnosis not present

## 2021-04-15 NOTE — Patient Instructions (Signed)
Access Code: [email or web address] URL: [email or web address] Date: 04/15/2021 Prepared by: Earlie Counts  Exercises Hooklying Transversus Abdominis Palpation - 1 x daily - 7 x weekly - 1 sets - 10 reps - 5 sec hold Sidelying Hip Adduction Isometric with Ball - 1 x daily - 7 x weekly - 1 sets - 10 reps - 5 sec hold Sit to Stand with Pelvic Floor Contraction - 1 x daily - 7 x weekly - 1 sets - 10 reps Supine Transversus Abdominis Bracing with Heel Slide - 1 x daily - 7 x weekly - 1 sets - 10 reps Hooklying Isometric Hip Flexion - 1 x daily - 7 x weekly - 1 sets - 10 reps Supine Bridge on Wall - 1 x daily - 7 x weekly - 1 sets - 10 reps Beginner Front Arm Support - 1 x daily - 7 x weekly - 1 sets - 10 reps  [facility] [address], [address] [place], [place] [address] Phone # [phone] Fax [phone]

## 2021-04-15 NOTE — Therapy (Signed)
[place] @ [facility], [place], [address] Phone: [phone]   Fax:  [phone]  Physical Therapy Treatment  Patient Details  Name: Dominique Scott MRN: [id number] Date of Birth: 1951-02-01 Referring Provider (PT): Dr. Hosie Poisson   Encounter Date: 04/15/2021   PT End of Session - 04/15/21 1523     Visit Number 4    Date for PT Re-Evaluation 05/27/21    Authorization Type A[employer]na medicare    Authorization - Visit Number 4    Authorization - Number of Visits 10    PT Start Time 4403    PT Stop Time 1525    PT Time Calculation (min) 40 min    Activity Tolerance Patient tolerated treatment well    Behavior During Therapy WFL for tasks assessed/performed             Past Medical History:  Diagnosis Date   Anxiety    Depression    Fibroid    GERD (gastroesophageal reflux disease)    Migraines     Past Surgical History:  Procedure Laterality Date   ABDOMINAL HYSTERECTOMY  1997   TAH-fibroids   INCONTINENCE SURGERY     bladder sling   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   Subjective Assessment - 04/15/21 1446     Subjective No fecal leakage. Abdominal tape helped with awarenss of the lower area.    Patient Stated Goals exercise program for the pelvic floor    Currently in Pain? No/denies    Multiple Pain Sites No                               [facility] Adult PT Treatment/Exercise - 04/15/21 0001       Exercises   Exercises Other Exercises    Other Exercises  go over exercise routine with bicep curl, tricep, dead lift,      Lumbar Exercises: Supine   Heel Slides 10 reps    Heel Slides Limitations each  leg with pelvic floor and lower abdominal contraction    Bridge with Ball Squeeze 10 reps;1 second    Bridge with [employer] Limitations with lower abdominal engagement, and pelvic  floor, feet on wall for more inversion      Lumbar Exercises: Prone   Straight Leg Raise 10 reps   left   Straight Leg Raises Limitations wtih gluteal contration first      Lumbar Exercises: Quadruped   Straight Leg Raise 10 reps    Straight Leg Raises Limitations each leg with tactile cues to keep spinal neutral   10 times more on the left to work on hip extension with gluteal squeeze     Manual Therapy   Manual Therapy Joint mobilization    Joint Mobilization anterior mobilization to left hip                     PT Education - 04/15/21 1523     Education Details Access Code: [email or web address]    Person(s) Educated Patient    Methods Explanation;Demonstration;Verbal cues;Handout    Comprehension Returned demonstration;Verbalized understanding              PT Short Term Goals - 04/15/21 1530       PT SHORT TERM GOAL #1   Title independent with basic  pelvic floor and abdomial program    Time 4    Period Weeks    Status Achieved      PT SHORT TERM GOAL #2   Title understand ways to manage cystocele with pressure management, positions to reduce the prolapse    Time 4    Period Weeks    Status Achieved      PT SHORT TERM GOAL #3   Title able to contract the abdominals without bulging the pelvic floor    Time 4    Period Weeks    Status Achieved               PT Long Term Goals - 04/15/21 1448       PT LONG TERM GOAL #1   Title Independent with advanced HEP for core and pelvic floor strength    Time 4    Period Months    Status On-going      PT LONG TERM GOAL #2   Title pelvic floor strength >/= 3/5 in supine and standing to support the cystocele    Time 4    Period Months    Status On-going      PT LONG TERM GOAL #3   Title able to demonstrate the correct lifting technique to not put pressure on the pelvic floor and breathing correctly    Time 4    Period Months    Status On-going      PT LONG TERM GOAL #4   Title exercising with weights  without lower abdominal bulge due to improved pelvic floor and abdominal contraction    Time 4    Period Months    Status On-going      PT LONG TERM GOAL #5   Title reduction of urinaty leakage with laughing and no fecal incontince due to improved pelvic floor strength    Baseline no fecal leakage or urinary leakage.    Time 4    Period Months    Status Achieved                   Plan - 04/15/21 1527     Clinical Impression Statement Patient is having no urinary or fecal leakage. Patient is not as award of her prolpase due to not going caudally. Patient is able to contract her lower abdominals better. Patient has difficulty with contracting her left gluteal for hip extension. Patient was instructed on correct lifting with her exercise by hinging at the hip and correct breath. Patient understands ways to reset her pelvic floor.    Personal Factors and Comorbidities Comorbidity 3+;Fitness    Comorbidities kidney cancer; bladder sling; Abdominal hysterectomy 1997; Fibroid    Examination-Activity Limitations Lift;Continence    Examination-Participation Restrictions Community Activity;Laundry    Stability/Clinical Decision Making Stable/Uncomplicated    Rehab Potential Excellent    PT Frequency 1x / week    PT Duration Other (comment)   4 months   PT Treatment/Interventions Biofeedback;Therapeutic activities;Therapeutic exercise;Neuromuscular re-education;Patient/family education;Manual techniques;Scar mobilization;Spinal Manipulations    PT Next Visit Plan review HEP; if doing well discharge    PT Home Exercise Plan Access Code: [email or web address]    Consulted and Agree with Plan of Care Patient             Patient will benefit from skilled therapeutic intervention in order to improve the following deficits and impairments:  Decreased coordination, Decreased range of motion, Increased fascial restricitons, Decreased activity tolerance, Decreased strength, Decreased mobility, Decreased  scar mobility  Visit Diagnosis: Muscle weakness (generalized)  Other lack of coordination     Problem List Patient Active Problem List   Diagnosis Date Noted   Back pain 01/26/2017   Hypertension 10/30/2016    Class: Chronic   Atrophic vaginitis 09/07/2012    Earlie Counts, PT 04/15/21 3:31 PM  [place] @ [place] [place], [place], [address] Phone: [phone]   Fax:  [phone]  Name: Dominique Scott MRN: [id number] Date of Birth: [birth year]

## 2021-04-29 ENCOUNTER — Encounter: Payer: Self-pay | Admitting: Physical Therapy

## 2021-04-29 ENCOUNTER — Other Ambulatory Visit: Payer: Self-pay

## 2021-04-29 ENCOUNTER — Ambulatory Visit: Payer: Medicare HMO | Admitting: Physical Therapy

## 2021-04-29 DIAGNOSIS — R278 Other lack of coordination: Secondary | ICD-10-CM

## 2021-04-29 DIAGNOSIS — M6281 Muscle weakness (generalized): Secondary | ICD-10-CM

## 2021-04-29 NOTE — Patient Instructions (Signed)
Access Code: [email or web address] URL: [email or web address] Date: 04/29/2021 Prepared by: Earlie Counts  Exercises Hooklying Transversus Abdominis Palpation - 1 x daily - 7 x weekly - 1 sets - 10 reps - 5 sec hold Sit to Stand with Pelvic Floor Contraction - 1 x daily - 7 x weekly - 1 sets - 10 reps Supine Bridge on Wall - 1 x daily - 3 x weekly - 1 sets - 10 reps Supine Dead Bug with Leg Extension - 1 x daily - 3 x weekly - 1 sets - 10 reps Quadruped Pelvic Floor Contraction with Opposite Arm and Leg Lift - 1 x daily - 3 x weekly - 2 sets - 10 reps Sidelying Pelvic Floor Contraction with Hip Abduction - 1 x daily - 3 x weekly - 2 sets - 10 reps  [facility] [address] [place], [place] [address] Phone # [phone] Fax [phone]

## 2021-04-29 NOTE — Therapy (Addendum)
[place] @ [place] [place], [place], [address] Phone: [phone]   Fax:  [phone]  Physical Therapy Treatment  Patient Details  Name: Dominique Scott MRN: [id number] Date of Birth: 07-01-1950 Referring Provider (PT): Dr. Hosie Poisson   Encounter Date: 04/29/2021   PT End of Session - 04/29/21 1518     Visit Number 5    Date for PT Re-Evaluation 05/27/21    Authorization Type Aetna medicare    Authorization - Visit Number 5    Authorization - Number of Visits 10    PT Start Time 5009    PT Stop Time 1515    PT Time Calculation (min) 30 min    Activity Tolerance Patient tolerated treatment well    Behavior During Therapy [facility] for tasks assessed/performed             Past Medical History:  Diagnosis Date   Anxiety    Depression    Fibroid    GERD (gastroesophageal reflux disease)    Migraines     Past Surgical History:  Procedure Laterality Date   ABDOMINAL HYSTERECTOMY  1997   TAH-fibroids   INCONTINENCE SURGERY     bladder sling   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   Subjective Assessment - 04/29/21 1447     Subjective Pelvic floor is doing good.    Patient Stated Goals exercise program for the pelvic floor    Currently in Pain? No/denies                [facility] PT Assessment - 04/29/21 0001       Assessment   Medical Diagnosis R15.9 incontinence of feces, unspecified fecal incontinence type    Referring Provider (PT) Dr. Hosie Poisson    Onset Date/Surgical Date --   07/2020   Prior Therapy none      Precautions   Precautions Other (comment)    Precaution Comments kidney cancer with abdominal surgery      Restrictions   Weight Bearing Restrictions No      [place] residence      Prior Function   Level of Independence Independent     Vocation Retired    Pharmacist, hospital; worked in [employer]    Leisure workout daily- walk, yoga, strength work      Cognition   Overall Cognitive Status Within Functional Limits for tasks assessed      Observation/Other Assessments   Skin Integrity abdominal scar has restrictions      AROM   Lumbar Extension decreased by 50%    Lumbar - Right Side Bend decreased by 25%    Lumbar - Left Side Bend decreased by 25%    Lumbar - Right Rotation decreased by 25%    Lumbar - Left Rotation decreased by 25%      Strength   Overall Strength Comments when contract the abdominals there is no  bulge of lower abdominals    Right Hip Flexion 5/5    Right Hip Extension 5/5    Right Hip External Rotation  5/5    Right Hip Internal Rotation 5/5    Right Hip ABduction 5/5    Left Hip Flexion 5/5    Left Hip External Rotation 5/5    Left Hip Internal Rotation 5/5    Left Hip ABduction  4+/5                        Pelvic Floor Special Questions - 04/29/21 0001     Urinary Leakage No    Fecal incontinence No               [facility] Adult PT Treatment/Exercise - 04/29/21 0001       Self-Care   Self-Care Other Self-Care Comments    Other Self-Care Comments  reviewed ways to manage prolapse, how to exercise correctly, pressure management      Lumbar Exercises: Supine   Dead Bug 20 reps;1 second    Dead Bug Limitations with pelvic floor contraction      Lumbar Exercises: Sidelying   Hip Abduction Right;Left;20 reps;1 second    Hip Abduction Weights (lbs) with pelvic floor contraction; not letting hips roll forward or backward      Lumbar Exercises: Quadruped   Opposite Arm/Leg Raise Right arm/Left leg;Left arm/Right leg;10 reps;1 second    Opposite Arm/Leg Raise Limitations with pelvic floor contraction                     PT Education - 04/29/21 1516     Education Details Access Code: [email or web address]; reviewed ways to manage a prolapse     Person(s) Educated Patient    Methods Explanation;Demonstration;Verbal cues;Handout    Comprehension Returned demonstration;Verbalized understanding              PT Short Term Goals - 04/29/21 1506       PT SHORT TERM GOAL #1   Title independent with basic pelvic floor and abdomial program    Time 4    Period Weeks    Status Achieved      PT SHORT TERM GOAL #2   Title understand ways to manage cystocele with pressure management, positions to reduce the prolapse    Time 4    Period Weeks    Status Achieved      PT SHORT TERM GOAL #3   Title able to contract the abdominals without bulging the pelvic floor    Time 4    Period Weeks    Status Achieved               PT Long Term Goals - 04/29/21 1506       PT LONG TERM GOAL #1   Title Independent with advanced HEP for core and pelvic floor strength    Time 4    Period Months    Status Achieved      PT LONG TERM GOAL #2   Title pelvic floor strength >/= 3/5 in supine and standing to support the cystocele    Time 4    Period Months    Status Achieved      PT LONG TERM GOAL #3   Title able to demonstrate the correct lifting technique to not put pressure on the pelvic floor and breathing correctly    Time 4    Period Months    Status Achieved      PT LONG TERM GOAL #4   Title exercising with weights without lower abdominal bulge due to improved pelvic floor and abdominal contraction    Time 4    Period Months    Status Achieved      PT LONG TERM GOAL #5   Title reduction of urinaty leakage with laughing and no fecal incontince due to improved pelvic floor strength  Time 4    Period Months    Status Achieved                   Plan - 04/29/21 1519     Clinical Impression Statement Patient is not having urine or fecal leakage. She is not feeling the prolapse. Patient understands how to manage prolapse, perfrom pressure management, and correct breath to not put pressure on the pelvic floor.  Patient is able to contract her lower abdominals with core exercise. She has increased strength in lower extremities. Patient is ready for discharge.    Personal Factors and Comorbidities Comorbidity 3+;Fitness    Comorbidities kidney cancer; bladder sling; Abdominal hysterectomy 1997; Fibroid    Examination-Activity Limitations Lift;Continence    Examination-Participation Restrictions Community Activity;Laundry    Stability/Clinical Decision Making Stable/Uncomplicated    Rehab Potential Excellent    PT Treatment/Interventions Biofeedback;Therapeutic activities;Therapeutic exercise;Neuromuscular re-education;Patient/family education;Manual techniques;Scar mobilization;Spinal Manipulations    PT Next Visit Plan Discharge to HEP    PT Home Exercise Plan Access Code: [email or web address]    [id number] and Agree with Plan of Care Patient             Patient will benefit from skilled therapeutic intervention in order to improve the following deficits and impairments:  Decreased coordination, Decreased range of motion, Increased fascial restricitons, Decreased activity tolerance, Decreased strength, Decreased mobility, Decreased scar mobility  Visit Diagnosis: Muscle weakness (generalized)  Other lack of coordination     Problem List Patient Active Problem List   Diagnosis Date Noted   Back pain 01/26/2017   Hypertension 10/30/2016    Class: Chronic   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    Remaining deficits: See above.    Education / Equipment: HEP   Patient agrees to discharge. Patient  goals were met. Patient is being discharged due to meeting the stated rehab goals. Thank you for the referral. Earlie Counts, PT 05/08/21 11:14 AM

## 2021-05-06 DIAGNOSIS — M25511 Pain in right shoulder: Secondary | ICD-10-CM | POA: Diagnosis not present

## 2021-05-13 ENCOUNTER — Encounter: Payer: Medicare HMO | Admitting: Physical Therapy

## 2021-05-15 ENCOUNTER — Encounter: Payer: Medicare HMO | Admitting: Physical Therapy

## 2021-05-16 DIAGNOSIS — Z20822 Contact with and (suspected) exposure to covid-19: Secondary | ICD-10-CM | POA: Diagnosis not present

## 2021-05-16 DIAGNOSIS — L84 Corns and callosities: Secondary | ICD-10-CM | POA: Diagnosis not present

## 2021-05-16 DIAGNOSIS — Z85828 Personal history of other malignant neoplasm of skin: Secondary | ICD-10-CM | POA: Diagnosis not present

## 2021-05-27 ENCOUNTER — Ambulatory Visit: Payer: Medicare HMO | Admitting: Physical Therapy

## 2021-05-27 ENCOUNTER — Other Ambulatory Visit: Payer: Self-pay | Admitting: Ophthalmology

## 2021-05-27 DIAGNOSIS — H11442 Conjunctival cysts, left eye: Secondary | ICD-10-CM | POA: Diagnosis not present

## 2021-05-27 DIAGNOSIS — D23111 Other benign neoplasm of skin of right upper eyelid, including canthus: Secondary | ICD-10-CM | POA: Diagnosis not present

## 2021-05-27 DIAGNOSIS — L988 Other specified disorders of the skin and subcutaneous tissue: Secondary | ICD-10-CM | POA: Diagnosis not present

## 2021-07-04 DIAGNOSIS — E559 Vitamin D deficiency, unspecified: Secondary | ICD-10-CM | POA: Diagnosis not present

## 2021-07-04 DIAGNOSIS — Z0000 Encounter for general adult medical examination without abnormal findings: Secondary | ICD-10-CM | POA: Diagnosis not present

## 2021-07-04 DIAGNOSIS — I10 Essential (primary) hypertension: Secondary | ICD-10-CM | POA: Diagnosis not present

## 2021-07-04 DIAGNOSIS — Z79899 Other long term (current) drug therapy: Secondary | ICD-10-CM | POA: Diagnosis not present

## 2021-07-04 DIAGNOSIS — E7800 Pure hypercholesterolemia, unspecified: Secondary | ICD-10-CM | POA: Diagnosis not present

## 2021-07-04 DIAGNOSIS — R69 Illness, unspecified: Secondary | ICD-10-CM | POA: Diagnosis not present

## 2021-07-08 ENCOUNTER — Other Ambulatory Visit: Payer: Self-pay | Admitting: Obstetrics and Gynecology

## 2021-07-08 ENCOUNTER — Other Ambulatory Visit: Payer: Self-pay | Admitting: Family Medicine

## 2021-07-08 DIAGNOSIS — E2839 Other primary ovarian failure: Secondary | ICD-10-CM

## 2021-07-08 NOTE — Telephone Encounter (Signed)
Medication refill request: Estrace vaginal cream Last AEX:  11-27-20 Next AEX: 08-22-21 BS Last MMG (if hormonal medication request): 03-14-21 Neg/BiRads1 Refill authorized: Please refill if appropriate

## 2021-07-12 ENCOUNTER — Ambulatory Visit
Admission: RE | Admit: 2021-07-12 | Discharge: 2021-07-12 | Disposition: A | Discharge status: Home / Self Care | Payer: Medicare HMO | Source: Ambulatory Visit | Attending: Family Medicine | Admitting: Family Medicine

## 2021-07-12 DIAGNOSIS — M8589 Other specified disorders of bone density and structure, multiple sites: Secondary | ICD-10-CM | POA: Diagnosis not present

## 2021-07-12 DIAGNOSIS — Z780 Asymptomatic menopausal state: Secondary | ICD-10-CM | POA: Diagnosis not present

## 2021-07-12 DIAGNOSIS — E2839 Other primary ovarian failure: Secondary | ICD-10-CM

## 2021-07-18 DIAGNOSIS — I10 Essential (primary) hypertension: Secondary | ICD-10-CM | POA: Diagnosis not present

## 2021-07-24 DIAGNOSIS — R69 Illness, unspecified: Secondary | ICD-10-CM | POA: Diagnosis not present

## 2021-07-24 DIAGNOSIS — E559 Vitamin D deficiency, unspecified: Secondary | ICD-10-CM | POA: Diagnosis not present

## 2021-07-24 DIAGNOSIS — F419 Anxiety disorder, unspecified: Secondary | ICD-10-CM | POA: Diagnosis not present

## 2021-07-24 DIAGNOSIS — M8589 Other specified disorders of bone density and structure, multiple sites: Secondary | ICD-10-CM | POA: Diagnosis not present

## 2021-07-26 NOTE — Progress Notes (Signed)
71 y.o. G3P1021 Divorced Caucasian female here for annual exam. Pt feeling worried about over doing it with PT for incontinence that she has been experiencing chronic pains in her hips.  Had 4 sessions of pelvic floor PT, and she finished this before Christmas last year.  Patient continues to do her exercises at home but does modify. She does have scoliosis and back pain.  ? ?Moving to [place] next week.  ? ?She has a history of fecal incontinence and urinary incontinence.  ?This is now much improved.  ?She states her fecal incontinence was likely diet related.  ? ?Patient is also followed for vaginal atrophy and use of vaginal estrogen. ?Uses twice weekly.  ?Would like a refill.  ? ?PCP: Sela Hilding, MD  ? ?Patient's last menstrual period was 06/10/1995.     ?  ?    ?Sexually active: No.  ?The current method of family planning is status post hysterectomy.    ?Exercising: Yes.     Weight training/cardio  ?Smoker:  no ? ?Health Maintenance: ?Pap: 1997-neg  ?History of abnormal Pap:  no ?MMG: 03/14/21-neg birads 1  ?Colonoscopy: 2019 polyp; next 5 yrs ?BMD: 07/12/21  Result osteopenia ?TDaP: 2018 ?Gardasil:   no ?HIV: ~2005-NR ?Hep C: ~2005-neg  ?Screening Labs: PCP.  ? ? reports that she has never smoked. She has never used smokeless tobacco. She reports that she does not currently use alcohol. She reports that she does not use drugs. ? ?Past Medical History:  ?Diagnosis Date  ? Anxiety   ? Depression   ? Fibroid   ? GERD (gastroesophageal reflux disease)   ? Migraines   ? ? ?Past Surgical History:  ?Procedure Laterality Date  ? ABDOMINAL HYSTERECTOMY  1997  ? TAH-fibroids  ? INCONTINENCE SURGERY    ? bladder sling  ? NEPHRECTOMY  1992  ? 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 ? ?Current Outpatient Medications  ?Medication Sig Dispense Refill  ? ALPRAZolam (XANAX) 0.25 MG tablet Take 0.25 mg by mouth at bedtime as needed for sleep.    ? amLODipine (NORVASC) 5 MG tablet Take 1 tablet by mouth daily.     ? atorvastatin (LIPITOR) 10 MG tablet Take 1 tablet by mouth daily.    ? Cholecalciferol (VITAMIN D3) 125 MCG (5000 UT) CAPS 2,000 Units.    ? escitalopram (LEXAPRO) 10 MG tablet Take 10 mg by mouth.    ? lisinopril (ZESTRIL) 20 MG tablet 1 tablet    ? Menaquinone-7 (VITAMIN K2) 100 MCG CAPS 1 capsule    ? RESTASIS 0.05 % ophthalmic emulsion 1 drop 2 (two) times daily.    ? cetirizine (ZYRTEC) 10 MG tablet Take 10 mg by mouth daily. (Patient not taking: Reported on 08/09/2021)    ? estradiol (ESTRACE) 0.1 MG/GM vaginal cream INSERT 1/2 GRAM VAGINALLY AT BEDTIME 2 TO 3 NIGHTS PER WEEK. 42.5 g 0  ? tretinoin (RETIN-A) 0.025 % cream  (Patient not taking: Reported on 08/09/2021)  0  ? ?No current facility-administered medications for this visit.  ? ? ?Family History  ?Problem Relation Age of Onset  ? Hypertension Father   ? ? ?Review of Systems  ?All other systems reviewed and are negative. ? ?Exam:   ?BP 136/78   Pulse 78   Ht 5\' 5"  (1.651 m)   Wt 127 lb (57.6 kg)   LMP 06/10/1995   SpO2 98%   BMI 21.13 kg/m?     ?General appearance: alert, cooperative and appears stated age ?Head:  normocephalic, without obvious abnormality, atraumatic ?Neck: no adenopathy, supple, symmetrical, trachea midline and thyroid normal to inspection and palpation ?Lungs: clear to auscultation bilaterally ?Breasts: normal appearance, no masses or tenderness, No nipple retraction or dimpling, No nipple discharge or bleeding, No axillary adenopathy ?Heart: regular rate and rhythm ?Abdomen: soft, non-tender; no masses, no organomegaly ?Extremities: extremities normal, atraumatic, no cyanosis or edema ?Skin: skin color, texture, turgor normal. No rashes or lesions ?Lymph nodes: cervical, supraclavicular, and axillary nodes normal. ?Neurologic: grossly normal ? ?Pelvic: External genitalia:  no lesions ?             No abnormal inguinal nodes palpated. ?             Urethra:  normal appearing urethra with no masses, tenderness or lesions ?              Bartholins and Skenes: normal    ?             Vagina: normal appearing vagina with normal color and discharge, no lesions.  Almost second degree cystocele and first degree rectocele.  Good apical support.  ?             Cervix: absent ?             Pap taken: no ?Bimanual Exam:  Uterus:  absent ?             Adnexa: no mass, fullness, tenderness ?             Rectal exam: yes.  Confirms. ?             Anus:  normal sphincter tone, no lesions ? ?Chaperone was present for exam: Lovena Le, CMA ? ?Assessment:   ?Well woman visit with gynecologic exam. ?Vaginal atrophy.  Using vaginal estrogen cream.  ?Status post TAH for fibroids.  Ovaries remain. ?Cystocele and rectocele.  ?Status post bladder sling. ?Status post right nephrectomy. ?Urinary incontinence and fecal incontinence improved.  ?Musculoskeletal pain.  Left hip pain and back pain.  ?Osteopenia. PCP following.  ? ?Plan: ?Mammogram screening discussed. ?Self breast awareness reviewed. ?Pap and HR HPV as above. ?Guidelines for Calcium, Vitamin D, regular exercise program including cardiovascular and weight bearing exercise. ?I recommend she discontinue her pelvic floor exercise, try Tylenol and heat for her pain.  ?If persists, I recommend she see an orthopedist/spine specialist for further evaluation.  ?Refill of vaginal estrogen cream.  I discussed potential effect on breast cancer. ?Follow up prn. ? ?After visit summary provided.  ? ?27 min  total time was spent for this patient encounter, including preparation, face-to-face counseling with the patient, coordination of care, and documentation of the encounter. ? ? ? ? ?

## 2021-08-07 DIAGNOSIS — E559 Vitamin D deficiency, unspecified: Secondary | ICD-10-CM | POA: Diagnosis not present

## 2021-08-09 ENCOUNTER — Encounter: Payer: Self-pay | Admitting: Obstetrics and Gynecology

## 2021-08-09 ENCOUNTER — Other Ambulatory Visit: Payer: Self-pay

## 2021-08-09 ENCOUNTER — Ambulatory Visit (INDEPENDENT_AMBULATORY_CARE_PROVIDER_SITE_OTHER): Payer: Medicare HMO | Admitting: Obstetrics and Gynecology

## 2021-08-09 VITALS — BP 136/78 | HR 78 | Ht 65.0 in | Wt 127.0 lb

## 2021-08-09 DIAGNOSIS — E559 Vitamin D deficiency, unspecified: Secondary | ICD-10-CM | POA: Insufficient documentation

## 2021-08-09 DIAGNOSIS — F325 Major depressive disorder, single episode, in full remission: Secondary | ICD-10-CM | POA: Insufficient documentation

## 2021-08-09 DIAGNOSIS — K219 Gastro-esophageal reflux disease without esophagitis: Secondary | ICD-10-CM | POA: Insufficient documentation

## 2021-08-09 DIAGNOSIS — E7800 Pure hypercholesterolemia, unspecified: Secondary | ICD-10-CM | POA: Insufficient documentation

## 2021-08-09 DIAGNOSIS — Z860100 Personal history of colon polyps, unspecified: Secondary | ICD-10-CM | POA: Insufficient documentation

## 2021-08-09 DIAGNOSIS — J301 Allergic rhinitis due to pollen: Secondary | ICD-10-CM | POA: Insufficient documentation

## 2021-08-09 DIAGNOSIS — G4700 Insomnia, unspecified: Secondary | ICD-10-CM | POA: Insufficient documentation

## 2021-08-09 DIAGNOSIS — N952 Postmenopausal atrophic vaginitis: Secondary | ICD-10-CM

## 2021-08-09 DIAGNOSIS — F419 Anxiety disorder, unspecified: Secondary | ICD-10-CM | POA: Insufficient documentation

## 2021-08-09 DIAGNOSIS — N8110 Cystocele, unspecified: Secondary | ICD-10-CM

## 2021-08-09 DIAGNOSIS — M25552 Pain in left hip: Secondary | ICD-10-CM

## 2021-08-09 DIAGNOSIS — Z01419 Encounter for gynecological examination (general) (routine) without abnormal findings: Secondary | ICD-10-CM

## 2021-08-09 DIAGNOSIS — K5730 Diverticulosis of large intestine without perforation or abscess without bleeding: Secondary | ICD-10-CM | POA: Insufficient documentation

## 2021-08-09 DIAGNOSIS — R1013 Epigastric pain: Secondary | ICD-10-CM | POA: Insufficient documentation

## 2021-08-09 MED ORDER — ESTRADIOL 0.1 MG/GM VA CREA: TOPICAL_CREAM | VAGINAL | 0 refills | Status: AC

## 2021-08-09 NOTE — Patient Instructions (Signed)

## 2021-08-22 ENCOUNTER — Ambulatory Visit: Payer: Medicare HMO | Admitting: Obstetrics and Gynecology

## 2021-10-08 ENCOUNTER — Ambulatory Visit: Payer: Medicare HMO | Admitting: Obstetrics and Gynecology

## 2021-11-28 ENCOUNTER — Ambulatory Visit: Payer: Medicare HMO | Admitting: Obstetrics and Gynecology

## 2023-09-19 IMAGING — MG MM DIGITAL SCREENING BILAT W/ TOMO AND CAD
8 series · 8 of 24 positions shown · non-contrast
Comparison: Previous exam(s).

CLINICAL DATA: Screening.

EXAM:
DIGITAL SCREENING BILATERAL MAMMOGRAM WITH TOMOSYNTHESIS AND CAD
TECHNIQUE: Bilateral screening digital craniocaudal and mediolateral oblique
mammograms were obtained. Bilateral screening digital breast
tomosynthesis was performed. The images were evaluated with
computer-aided detection.

[R MLO synth-2D]
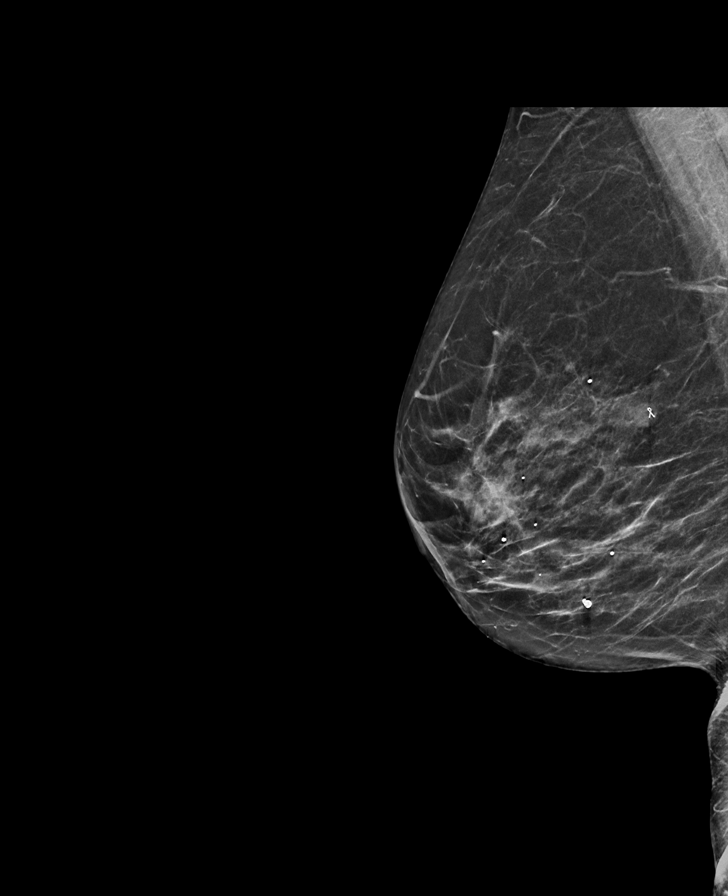

[R CC synth-2D]
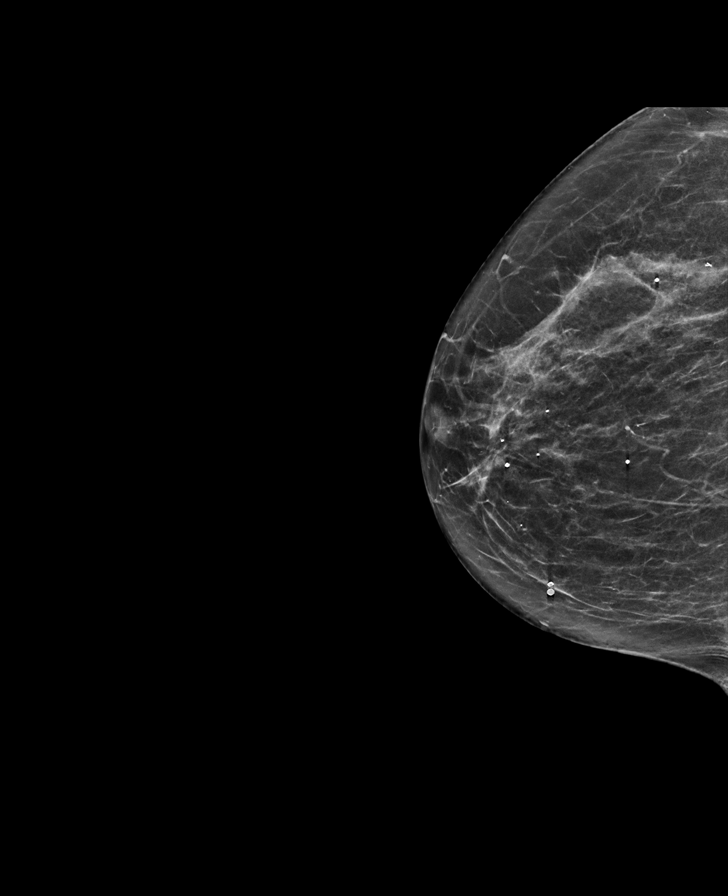

[L MLO synth-2D]
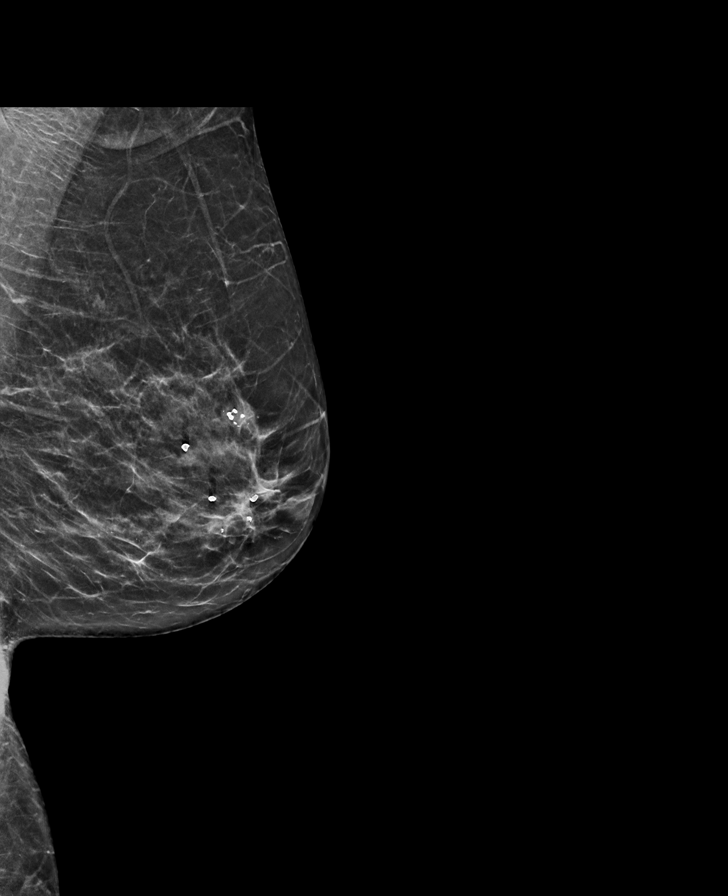

[L CC synth-2D]
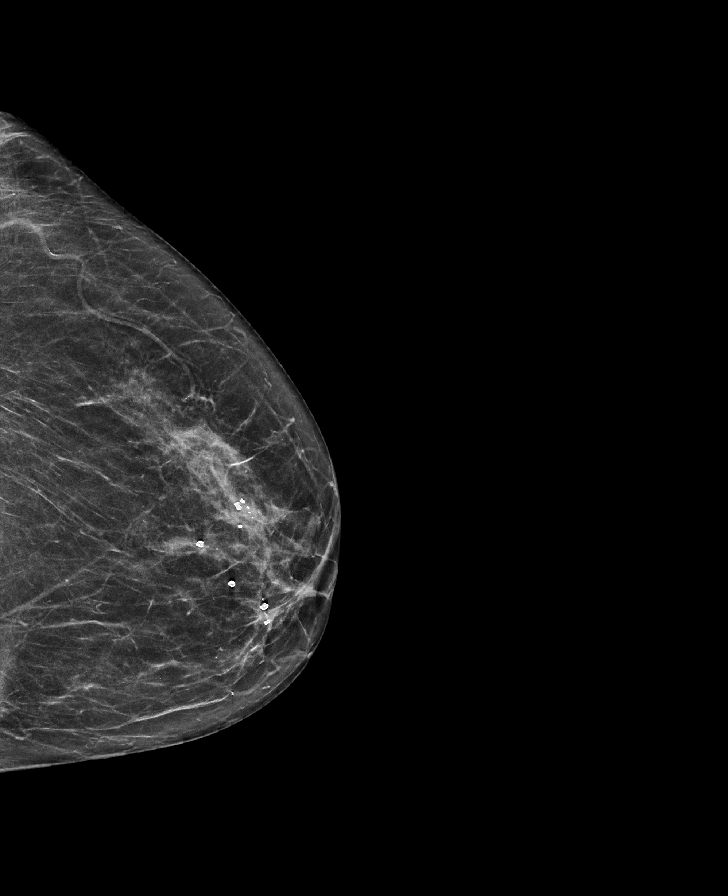

[L MLO tomo · tomo slice 30/59.0]
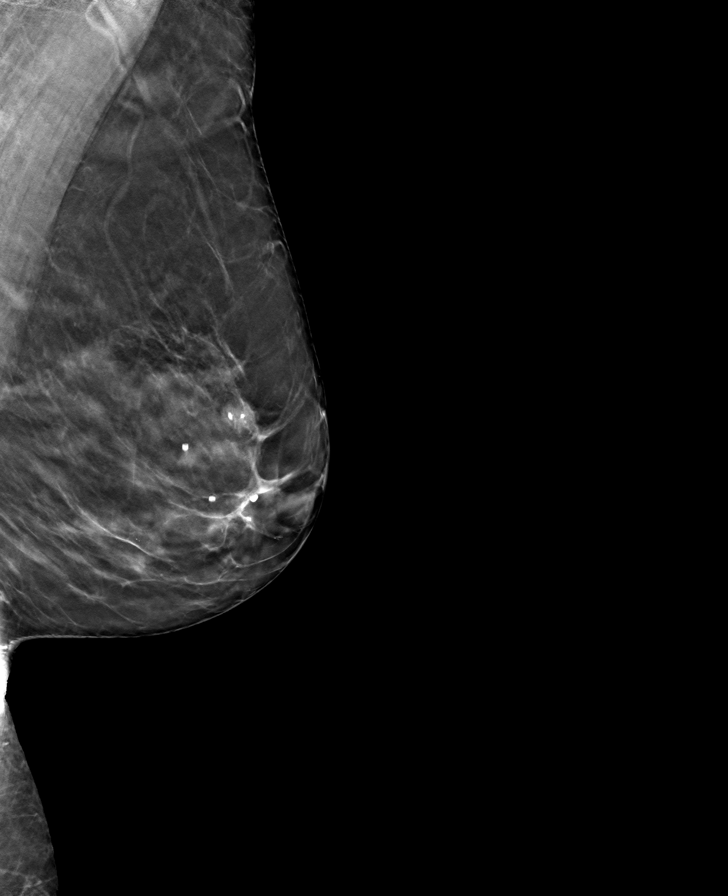

[L CC tomo · tomo slice 31/62.0]
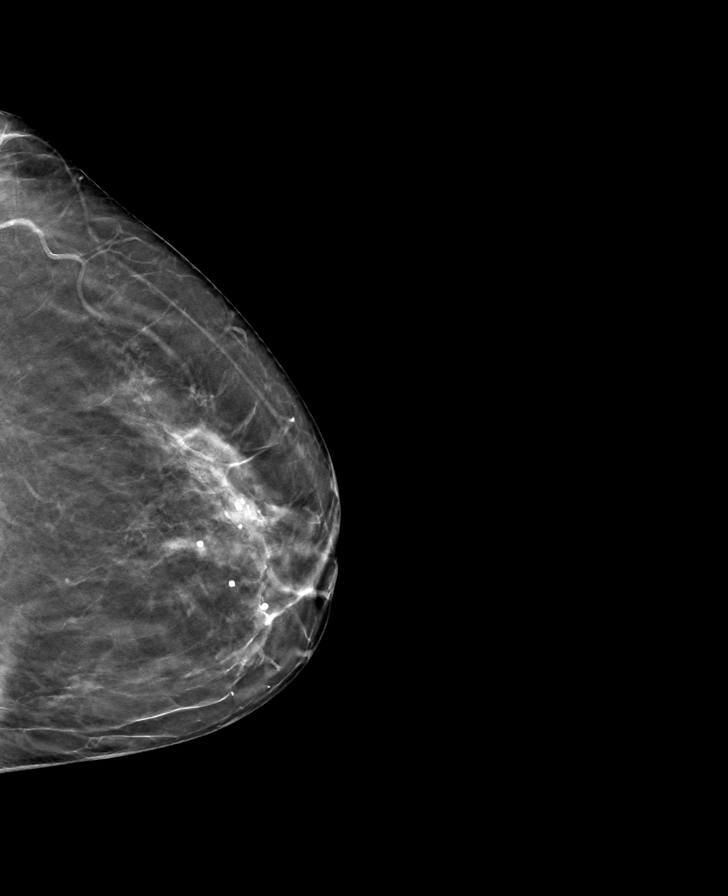

[R CC tomo · tomo slice 31/61.0]
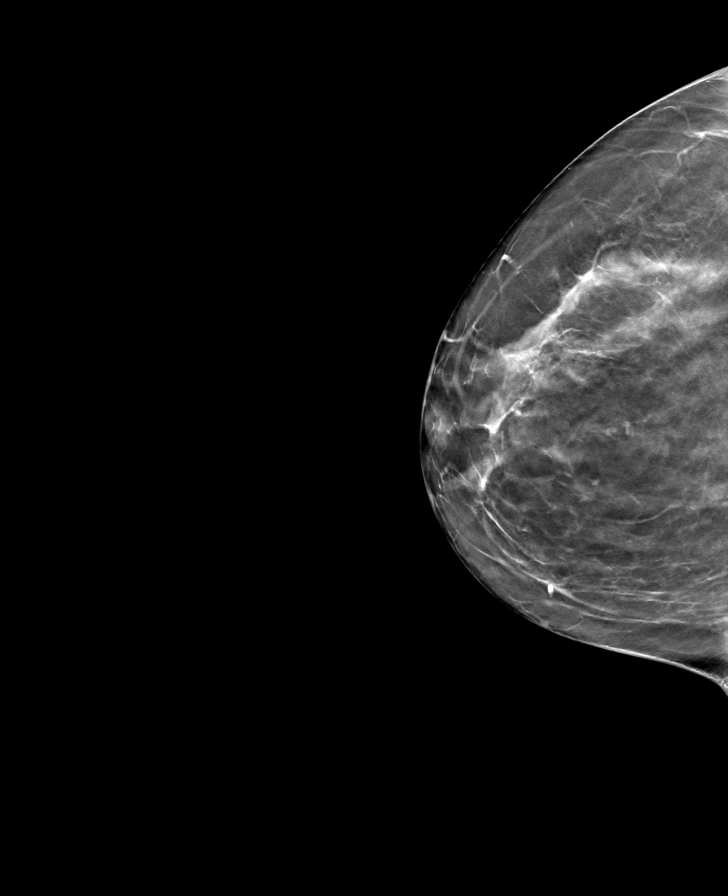

[R MLO tomo · tomo slice 31/60.0]
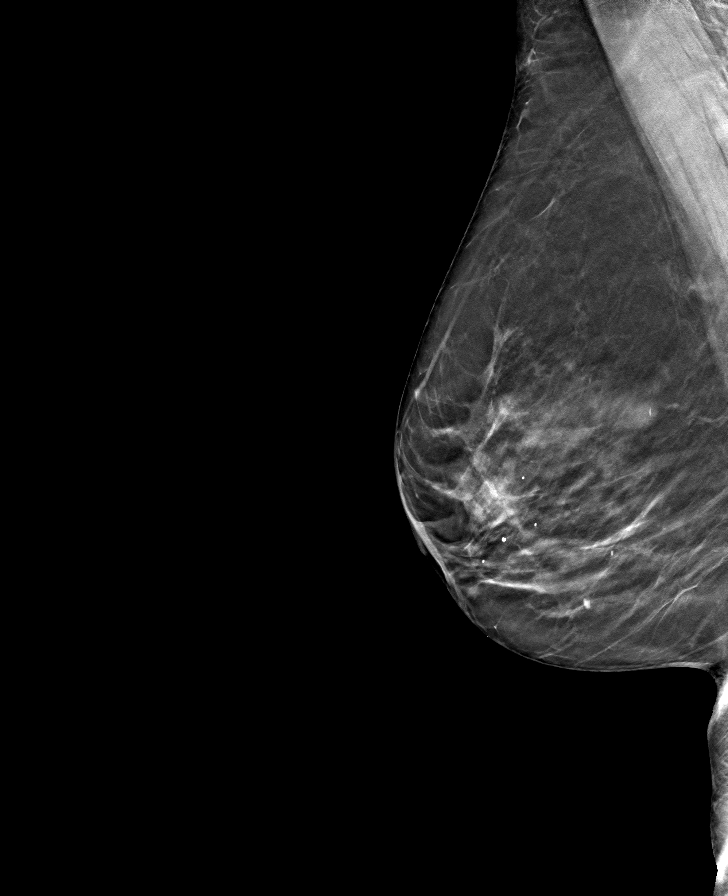

[8 of 24 positions shown; findings below may reference images not displayed]

ACR Breast Density Category b: There are scattered areas of
fibroglandular density.
FINDINGS: There are no findings suspicious for malignancy.
IMPRESSION: No mammographic evidence of malignancy. A result letter of this
screening mammogram will be mailed directly to the patient.

RECOMMENDATION:
Screening mammogram in one year. (Code:51-O-LD2)

BI-RADS CATEGORY  1: Negative.
